# Patient Record
Sex: Female | Born: 1960
Health system: Southern US, Community
[De-identification: ages and names within clinical notes are randomized; demographics above are authoritative.]

## PROBLEM LIST (undated history)

## (undated) DIAGNOSIS — Z5189 Encounter for other specified aftercare: Secondary | ICD-10-CM

## (undated) DIAGNOSIS — T7840XA Allergy, unspecified, initial encounter: Secondary | ICD-10-CM

## (undated) DIAGNOSIS — E119 Type 2 diabetes mellitus without complications: Secondary | ICD-10-CM

## (undated) DIAGNOSIS — D649 Anemia, unspecified: Secondary | ICD-10-CM

## (undated) DIAGNOSIS — I341 Nonrheumatic mitral (valve) prolapse: Secondary | ICD-10-CM

## (undated) HISTORY — DX: Allergy, unspecified, initial encounter: T78.40XA

## (undated) HISTORY — DX: Type 2 diabetes mellitus without complications: E11.9

## (undated) HISTORY — DX: Anemia, unspecified: D64.9

## (undated) HISTORY — PX: APPENDECTOMY: SHX54

## (undated) HISTORY — DX: Encounter for other specified aftercare: Z51.89

## (undated) HISTORY — DX: Nonrheumatic mitral (valve) prolapse: I34.1

## (undated) HISTORY — PX: ROTATOR CUFF REPAIR: SHX139

---

## 1982-10-11 DIAGNOSIS — Z5189 Encounter for other specified aftercare: Secondary | ICD-10-CM

## 1982-10-11 HISTORY — DX: Encounter for other specified aftercare: Z51.89

## 1982-10-11 HISTORY — PX: TUBAL LIGATION: SHX77

## 2001-04-02 ENCOUNTER — Emergency Department (HOSPITAL_COMMUNITY): Admission: EM | Admit: 2001-04-02 | Discharge: 2001-04-02 | Payer: Self-pay | Admitting: Emergency Medicine

## 2001-04-02 ENCOUNTER — Encounter: Payer: Self-pay | Admitting: Emergency Medicine

## 2001-12-20 ENCOUNTER — Encounter: Admission: RE | Admit: 2001-12-20 | Discharge: 2001-12-20 | Payer: Self-pay | Admitting: Family Medicine

## 2002-02-09 ENCOUNTER — Encounter: Admission: RE | Admit: 2002-02-09 | Discharge: 2002-02-09 | Payer: Self-pay | Admitting: Family Medicine

## 2002-02-14 ENCOUNTER — Encounter: Payer: Self-pay | Admitting: Sports Medicine

## 2002-02-14 ENCOUNTER — Encounter: Admission: RE | Admit: 2002-02-14 | Discharge: 2002-02-14 | Payer: Self-pay | Admitting: Sports Medicine

## 2002-02-21 ENCOUNTER — Encounter: Admission: RE | Admit: 2002-02-21 | Discharge: 2002-02-21 | Payer: Self-pay | Admitting: Family Medicine

## 2002-05-10 ENCOUNTER — Encounter: Admission: RE | Admit: 2002-05-10 | Discharge: 2002-05-10 | Payer: Self-pay | Admitting: Family Medicine

## 2002-05-10 ENCOUNTER — Ambulatory Visit (HOSPITAL_COMMUNITY): Admission: RE | Admit: 2002-05-10 | Discharge: 2002-05-10 | Payer: Self-pay | Admitting: Family Medicine

## 2002-05-11 ENCOUNTER — Encounter: Admission: RE | Admit: 2002-05-11 | Discharge: 2002-05-11 | Payer: Self-pay | Admitting: Sports Medicine

## 2002-05-11 ENCOUNTER — Encounter: Payer: Self-pay | Admitting: Sports Medicine

## 2002-05-14 ENCOUNTER — Ambulatory Visit (HOSPITAL_COMMUNITY): Admission: RE | Admit: 2002-05-14 | Discharge: 2002-05-14 | Payer: Self-pay | Admitting: *Deleted

## 2002-05-25 ENCOUNTER — Encounter: Admission: RE | Admit: 2002-05-25 | Discharge: 2002-05-25 | Payer: Self-pay | Admitting: Family Medicine

## 2003-08-12 ENCOUNTER — Encounter (INDEPENDENT_AMBULATORY_CARE_PROVIDER_SITE_OTHER): Payer: Self-pay | Admitting: *Deleted

## 2003-08-12 LAB — CONVERTED CEMR LAB

## 2003-08-14 ENCOUNTER — Encounter: Admission: RE | Admit: 2003-08-14 | Discharge: 2003-08-14 | Payer: Self-pay | Admitting: Family Medicine

## 2003-09-25 ENCOUNTER — Encounter: Admission: RE | Admit: 2003-09-25 | Discharge: 2003-09-25 | Payer: Self-pay | Admitting: Sports Medicine

## 2004-07-01 ENCOUNTER — Ambulatory Visit: Payer: Self-pay | Admitting: Family Medicine

## 2004-07-01 ENCOUNTER — Encounter: Admission: RE | Admit: 2004-07-01 | Discharge: 2004-07-01 | Payer: Self-pay | Admitting: Sports Medicine

## 2004-07-03 ENCOUNTER — Ambulatory Visit: Payer: Self-pay | Admitting: Family Medicine

## 2006-12-08 DIAGNOSIS — R350 Frequency of micturition: Secondary | ICD-10-CM | POA: Insufficient documentation

## 2006-12-08 DIAGNOSIS — D649 Anemia, unspecified: Secondary | ICD-10-CM

## 2006-12-08 DIAGNOSIS — I341 Nonrheumatic mitral (valve) prolapse: Secondary | ICD-10-CM

## 2006-12-09 ENCOUNTER — Encounter (INDEPENDENT_AMBULATORY_CARE_PROVIDER_SITE_OTHER): Payer: Self-pay | Admitting: *Deleted

## 2007-03-14 ENCOUNTER — Other Ambulatory Visit: Admission: RE | Admit: 2007-03-14 | Discharge: 2007-03-14 | Payer: Self-pay | Admitting: Internal Medicine

## 2007-03-16 ENCOUNTER — Encounter: Admission: RE | Admit: 2007-03-16 | Discharge: 2007-03-16 | Payer: Self-pay | Admitting: Internal Medicine

## 2008-03-25 ENCOUNTER — Encounter: Admission: RE | Admit: 2008-03-25 | Discharge: 2008-03-25 | Payer: Self-pay | Admitting: Internal Medicine

## 2008-11-07 ENCOUNTER — Other Ambulatory Visit: Admission: RE | Admit: 2008-11-07 | Discharge: 2008-11-07 | Payer: Self-pay | Admitting: Internal Medicine

## 2008-11-18 ENCOUNTER — Encounter: Admission: RE | Admit: 2008-11-18 | Discharge: 2008-11-18 | Payer: Self-pay | Admitting: Internal Medicine

## 2009-03-13 ENCOUNTER — Emergency Department (HOSPITAL_COMMUNITY): Admission: EM | Admit: 2009-03-13 | Discharge: 2009-03-13 | Payer: Self-pay | Admitting: Emergency Medicine

## 2009-12-12 ENCOUNTER — Other Ambulatory Visit: Admission: RE | Admit: 2009-12-12 | Discharge: 2009-12-12 | Payer: Self-pay | Admitting: Internal Medicine

## 2010-07-10 ENCOUNTER — Emergency Department (HOSPITAL_COMMUNITY): Admission: EM | Admit: 2010-07-10 | Discharge: 2010-07-10 | Payer: Self-pay | Admitting: Emergency Medicine

## 2010-11-12 ENCOUNTER — Other Ambulatory Visit: Payer: Self-pay | Admitting: Physical Medicine and Rehabilitation

## 2010-11-12 DIAGNOSIS — R52 Pain, unspecified: Secondary | ICD-10-CM

## 2010-11-15 ENCOUNTER — Ambulatory Visit
Admission: RE | Admit: 2010-11-15 | Discharge: 2010-11-15 | Disposition: A | Discharge status: Home / Self Care | Payer: BC Managed Care – PPO | Source: Ambulatory Visit | Attending: Physical Medicine and Rehabilitation | Admitting: Physical Medicine and Rehabilitation

## 2010-11-15 DIAGNOSIS — R52 Pain, unspecified: Secondary | ICD-10-CM

## 2011-02-25 ENCOUNTER — Other Ambulatory Visit: Payer: Self-pay | Admitting: Internal Medicine

## 2011-02-25 DIAGNOSIS — Z1231 Encounter for screening mammogram for malignant neoplasm of breast: Secondary | ICD-10-CM

## 2011-02-26 ENCOUNTER — Ambulatory Visit
Admission: RE | Admit: 2011-02-26 | Discharge: 2011-02-26 | Disposition: A | Discharge status: Home / Self Care | Payer: BC Managed Care – PPO | Source: Ambulatory Visit | Attending: Internal Medicine | Admitting: Internal Medicine

## 2011-02-26 DIAGNOSIS — Z1231 Encounter for screening mammogram for malignant neoplasm of breast: Secondary | ICD-10-CM

## 2011-03-17 ENCOUNTER — Other Ambulatory Visit: Payer: Self-pay | Admitting: Obstetrics and Gynecology

## 2011-03-17 ENCOUNTER — Other Ambulatory Visit (HOSPITAL_COMMUNITY)
Admission: RE | Admit: 2011-03-17 | Discharge: 2011-03-17 | Disposition: A | Discharge status: Home / Self Care | Payer: BC Managed Care – PPO | Source: Ambulatory Visit | Attending: Obstetrics and Gynecology | Admitting: Obstetrics and Gynecology

## 2011-03-17 DIAGNOSIS — Z01419 Encounter for gynecological examination (general) (routine) without abnormal findings: Secondary | ICD-10-CM | POA: Insufficient documentation

## 2011-03-29 ENCOUNTER — Inpatient Hospital Stay (INDEPENDENT_AMBULATORY_CARE_PROVIDER_SITE_OTHER)
Admission: RE | Admit: 2011-03-29 | Discharge: 2011-03-29 | Disposition: A | Discharge status: Home / Self Care | Payer: BC Managed Care – PPO | Source: Ambulatory Visit | Attending: Family Medicine | Admitting: Family Medicine

## 2011-03-29 DIAGNOSIS — L02419 Cutaneous abscess of limb, unspecified: Secondary | ICD-10-CM

## 2011-07-12 ENCOUNTER — Other Ambulatory Visit: Payer: Self-pay | Admitting: Gastroenterology

## 2011-09-22 ENCOUNTER — Ambulatory Visit: Payer: BC Managed Care – PPO | Attending: Physical Medicine and Rehabilitation | Admitting: Physical Therapy

## 2011-09-22 DIAGNOSIS — M25519 Pain in unspecified shoulder: Secondary | ICD-10-CM | POA: Insufficient documentation

## 2011-09-22 DIAGNOSIS — M25539 Pain in unspecified wrist: Secondary | ICD-10-CM | POA: Insufficient documentation

## 2011-09-22 DIAGNOSIS — IMO0001 Reserved for inherently not codable concepts without codable children: Secondary | ICD-10-CM | POA: Insufficient documentation

## 2011-09-22 DIAGNOSIS — M25619 Stiffness of unspecified shoulder, not elsewhere classified: Secondary | ICD-10-CM | POA: Insufficient documentation

## 2011-10-01 ENCOUNTER — Ambulatory Visit: Payer: BC Managed Care – PPO

## 2011-10-06 ENCOUNTER — Ambulatory Visit: Payer: BC Managed Care – PPO | Admitting: Physical Therapy

## 2011-10-13 ENCOUNTER — Encounter: Payer: BC Managed Care – PPO | Admitting: Physical Therapy

## 2011-10-19 ENCOUNTER — Ambulatory Visit: Payer: BC Managed Care – PPO | Attending: Physical Medicine and Rehabilitation | Admitting: Physical Therapy

## 2011-10-19 DIAGNOSIS — M25619 Stiffness of unspecified shoulder, not elsewhere classified: Secondary | ICD-10-CM | POA: Insufficient documentation

## 2011-10-19 DIAGNOSIS — M25539 Pain in unspecified wrist: Secondary | ICD-10-CM | POA: Insufficient documentation

## 2011-10-19 DIAGNOSIS — IMO0001 Reserved for inherently not codable concepts without codable children: Secondary | ICD-10-CM | POA: Insufficient documentation

## 2011-10-19 DIAGNOSIS — M25519 Pain in unspecified shoulder: Secondary | ICD-10-CM | POA: Insufficient documentation

## 2011-10-26 ENCOUNTER — Ambulatory Visit: Payer: BC Managed Care – PPO | Admitting: Physical Therapy

## 2011-10-27 ENCOUNTER — Encounter: Payer: BC Managed Care – PPO | Admitting: Physical Therapy

## 2011-11-02 ENCOUNTER — Encounter: Payer: BC Managed Care – PPO | Admitting: Rehabilitative and Restorative Service Providers"

## 2011-11-05 ENCOUNTER — Encounter: Payer: BC Managed Care – PPO | Admitting: Physical Therapy

## 2011-11-10 ENCOUNTER — Other Ambulatory Visit: Payer: Self-pay | Admitting: Orthopedic Surgery

## 2011-11-10 DIAGNOSIS — M25512 Pain in left shoulder: Secondary | ICD-10-CM

## 2011-11-17 ENCOUNTER — Ambulatory Visit
Admission: RE | Admit: 2011-11-17 | Discharge: 2011-11-17 | Disposition: A | Discharge status: Home / Self Care | Payer: BC Managed Care – PPO | Source: Ambulatory Visit | Attending: Orthopedic Surgery | Admitting: Orthopedic Surgery

## 2011-11-17 DIAGNOSIS — M25512 Pain in left shoulder: Secondary | ICD-10-CM

## 2012-07-12 ENCOUNTER — Other Ambulatory Visit: Payer: Self-pay | Admitting: Internal Medicine

## 2012-07-12 DIAGNOSIS — Z1231 Encounter for screening mammogram for malignant neoplasm of breast: Secondary | ICD-10-CM

## 2012-08-29 ENCOUNTER — Other Ambulatory Visit (HOSPITAL_COMMUNITY)
Admission: RE | Admit: 2012-08-29 | Discharge: 2012-08-29 | Disposition: A | Discharge status: Home / Self Care | Payer: BC Managed Care – PPO | Source: Ambulatory Visit | Attending: Obstetrics and Gynecology | Admitting: Obstetrics and Gynecology

## 2012-08-29 ENCOUNTER — Ambulatory Visit
Admission: RE | Admit: 2012-08-29 | Discharge: 2012-08-29 | Disposition: A | Discharge status: Home / Self Care | Payer: BC Managed Care – PPO | Source: Ambulatory Visit | Attending: Internal Medicine | Admitting: Internal Medicine

## 2012-08-29 ENCOUNTER — Other Ambulatory Visit: Payer: Self-pay | Admitting: Obstetrics and Gynecology

## 2012-08-29 DIAGNOSIS — Z01419 Encounter for gynecological examination (general) (routine) without abnormal findings: Secondary | ICD-10-CM | POA: Insufficient documentation

## 2012-08-29 DIAGNOSIS — Z1231 Encounter for screening mammogram for malignant neoplasm of breast: Secondary | ICD-10-CM

## 2013-06-18 ENCOUNTER — Emergency Department (HOSPITAL_COMMUNITY)
Admission: EM | Admit: 2013-06-18 | Discharge: 2013-06-18 | Disposition: A | Discharge status: Home / Self Care | Payer: BC Managed Care – PPO | Attending: Emergency Medicine | Admitting: Emergency Medicine

## 2013-06-18 ENCOUNTER — Encounter (HOSPITAL_COMMUNITY): Payer: Self-pay | Admitting: Emergency Medicine

## 2013-06-18 ENCOUNTER — Emergency Department (HOSPITAL_COMMUNITY): Payer: BC Managed Care – PPO

## 2013-06-18 DIAGNOSIS — R404 Transient alteration of awareness: Secondary | ICD-10-CM

## 2013-06-18 DIAGNOSIS — R4189 Other symptoms and signs involving cognitive functions and awareness: Secondary | ICD-10-CM

## 2013-06-18 DIAGNOSIS — R4701 Aphasia: Secondary | ICD-10-CM | POA: Insufficient documentation

## 2013-06-18 DIAGNOSIS — R51 Headache: Secondary | ICD-10-CM | POA: Insufficient documentation

## 2013-06-18 DIAGNOSIS — Z3202 Encounter for pregnancy test, result negative: Secondary | ICD-10-CM | POA: Insufficient documentation

## 2013-06-18 DIAGNOSIS — R55 Syncope and collapse: Secondary | ICD-10-CM | POA: Insufficient documentation

## 2013-06-18 DIAGNOSIS — R5381 Other malaise: Secondary | ICD-10-CM | POA: Insufficient documentation

## 2013-06-18 LAB — PREGNANCY, URINE: Preg Test, Ur: NEGATIVE

## 2013-06-18 LAB — COMPREHENSIVE METABOLIC PANEL
ALT: 20 U/L (ref 0–35)
AST: 19 U/L (ref 0–37)
Albumin: 3.8 g/dL (ref 3.5–5.2)
CO2: 26 mEq/L (ref 19–32)
Chloride: 100 mEq/L (ref 96–112)
Creatinine, Ser: 0.65 mg/dL (ref 0.50–1.10)
GFR calc non Af Amer: >90 mL/min (ref >90)
Potassium: 3.7 mEq/L (ref 3.5–5.1)
Sodium: 135 mEq/L (ref 135–145)
Total Bilirubin: 0.4 mg/dL (ref 0.3–1.2)

## 2013-06-18 LAB — URINALYSIS, ROUTINE W REFLEX MICROSCOPIC
Glucose, UA: NEGATIVE mg/dL
Ketones, ur: NEGATIVE mg/dL
Leukocytes, UA: NEGATIVE
pH: 6 (ref 5.0–8.0)

## 2013-06-18 LAB — URINE MICROSCOPIC-ADD ON

## 2013-06-18 LAB — CBC
MCV: 84.5 fL (ref 78.0–100.0)
Platelets: 229 10*3/uL (ref 150–400)
RBC: 4.26 MIL/uL (ref 3.87–5.11)
RDW: 12.5 % (ref 11.5–15.5)
WBC: 4.6 10*3/uL (ref 4.0–10.5)

## 2013-06-18 MED ORDER — ACETAMINOPHEN 325 MG PO TABS
650.0000 mg | ORAL_TABLET | Freq: Once | ORAL | Status: AC
  Administered 2013-06-18: 650 mg via ORAL
  Filled 2013-06-18: qty 2

## 2013-06-18 NOTE — Consult Note (Signed)
Neurology Consultation Reason for Consult: unresponsiveness Referring Physician: Jarome Matin,   CC: Unresponsiveness  History is obtained from:Patient  HPI: Teresa West is a 52 y.o. female who was at work, feeling "bad" all morning and then began having chest pain as well as lightheadedness. She was helped to the floor where she became unresopnsive for approximately one minute. EMS was called. On arrival, she would not speak and seemed to have bilateral weakness. She was brought to the ER>   LKW: 10:15 am  tpa given: no, symptoms resolved, NIHSS 0    ROS: A 14 point ROS was performed and is negative except as noted in the HPI.  PMH: None  Family History: DM, HTN  Social History: Tob: denies  Exam: Current vital signs: There were no vitals taken for this visit. Vital signs in last 24 hours:    General: in bed, NAD CV: rrr The patient had multiple inconsistencies in her exam, but with repeated testing was seen to be without deficit.   Mental Status: Patient is awake, alert, oriented to person, place, month, year, and situation. Immediate and remote memory are intact. Patient is able to give a clear and coherent history. No signs of aphasia or neglect Cranial Nerves: II: Visual Fields are full. Pupils are equal, round, and reactive to light.  Discs are without papilledema. III,IV, VI: EOMI without ptosis or diploplia.  V: Facial sensation is symmetric to temperature VII: Facial movement is symmetric.  VIII: hearing is intact to voice X: Uvula elevates symmetrically XI: Shoulder shrug is symmetric. XII: tongue is midline without atrophy or fasciculations.  Motor: Tone is normal. Bulk is normal. 5/5 strength was present in all four extremities, though inconsistent exam.  Sensory: Sensation is symmetric to light touch and temperature in the arms and legs. Deep Tendon Reflexes: 2+ and symmetric in the biceps and patellae.  Plantars: Toes are downgoing bilaterally.   Cerebellar: FNF are intact bilaterally Gait: Not assessed due to acute nature of evaluation and multiple medical monitors in ED setting as well as chest pain.    I have reviewed labs in epic and the results pertinent to this consultation are: pending  I have reviewed the images obtained:CT head - negatvie  Impression: 52 yo F with sncope in the setting of lightheadness and chest pain. Her exam(including distractible tremor and inconsistencies) is suggestive of a psychogenic etiology to her neurological symptoms. I have a very low suspicion for seizure and would not initiate workup or treatment unless she had other events in the future concerning for seizure.   Recommendations: 1) Defer further workup for chest pain/syncope to ED physicians. 2) Neurology will sign off, please call if there are any further questions.    Ritta Slot, MD [facility] [phone]  If 7pm- 7am, please page neurology on call at [phone].

## 2013-06-18 NOTE — ED Notes (Signed)
Code stroke cancelled per Dr. Kirkpatrick °

## 2013-06-18 NOTE — ED Notes (Signed)
C/o h/a

## 2013-06-18 NOTE — ED Notes (Signed)
EDP exam/cleared for CT.  

## 2013-06-18 NOTE — Code Documentation (Signed)
52 year old female presented to [facility] via [facility] as code stroke. Stroke was called in the field at 1042.  Patient arrived to ED at 1048.  LSW 1015.  EDP exam at 1049 - cleared for CT scan.  Per report patient was at work this AM and c/o not feeling well all morning - had sudden episode of feeling light headed and stated she was going to pass out.  Co-workers lowered her to the ground.  She had approximate one minute of blank stare without abnormal movement no incontinence - then would not talk.  On exam she exhibited some inconsistencies without clear focal signs - NIHSS 0. Dr. Amada Jupiter at bedside.  Sx resolved - clock reset at 1108 - handoff to [employer] -  Call as needed.

## 2013-06-18 NOTE — ED Notes (Signed)
Pt in room with husband-- eating [place] sandwich-- ambulated to bathroom without difficulty

## 2013-06-18 NOTE — ED Provider Notes (Signed)
CSN: [id number]     Arrival date & time 06/18/13  1048 History   First MD Initiated Contact with Patient 06/18/13 1122     Chief Complaint  Patient presents with  . Code Stroke   (Consider location/radiation/quality/duration/timing/severity/associated sxs/prior Treatment) HPI Comments: 52 yo AA female presents via EMS for concern for syncope/stroke.  Pt states she hasn't felt well this AM.  She went to work and became lightheaded and "blacked out".  She was helped to the floor by colleagues. Upon arousal she was weak and aphasic.  EMS was called.   Pt denies medications.  No recent infections.  Pt is very involved in planning her daughter's wedding and she is somewhat "stressed" b/c of this.  No f/c, n/v/d, abd pain, vaginal symptoms, GU symptoms.  No h/o seizure or neurologic d/o.    "Code stroke" initiated and pt went directly to CT upon arrival where she was met by myself and Dr. Luan Pulling (neuro).  CT was neg and pt's exam was inconsistent with stroke.  Pt  Patient is a 52 y.o. female presenting with syncope. The history is provided by the patient.  Loss of Consciousness Episode history:  Single Duration:  10 seconds Timing:  Constant Chronicity:  New Context: normal activity   Context: not blood draw, not bowel movement, not dehydration, not exertion, not inactivity, not medication change, not sight of blood, not sitting down, not standing up and not urination   Witnessed: yes   Relieved by:  Nothing Worsened by:  Nothing tried Associated symptoms: headaches and weakness   Associated symptoms: no dizziness and no seizures     No past medical history on file. No past surgical history on file. No family history on file. History  Substance Use Topics  . Smoking status: Not on file  . Smokeless tobacco: Not on file  . Alcohol Use: Not on file   OB History   No data available     Review of Systems  Eyes: Negative.   Respiratory: Negative.   Cardiovascular: Positive for  syncope.  Gastrointestinal: Negative.   Musculoskeletal: Negative.   Neurological: Positive for speech difficulty, weakness and headaches. Negative for dizziness, tremors, seizures, syncope, facial asymmetry, light-headedness and numbness.       EMS reported aphasia and global weakness, but these symptoms stopped once she arrived to ER.    Psychiatric/Behavioral: Negative.     Allergies  Review of patient's allergies indicates not on file.  Home Medications  No current outpatient prescriptions on file. BP 121/64  Pulse 62  Temp(Src) 98 F (36.7 C) (Oral)  Resp 14  SpO2 99%  LMP 01/05/2011 Physical Exam  Constitutional: She is oriented to person, place, and time. She appears well-developed and well-nourished.  HENT:  Head: Normocephalic and atraumatic.  Eyes: Conjunctivae and EOM are normal. Pupils are equal, round, and reactive to light.  Neck: Normal range of motion. Neck supple.  Cardiovascular: Normal rate and regular rhythm.   Pulmonary/Chest: Effort normal and breath sounds normal.  Abdominal: Soft. Bowel sounds are normal.  Musculoskeletal: Normal range of motion.  Neurological: She is alert and oriented to person, place, and time. She has normal reflexes. No cranial nerve deficit. Coordination normal.  Full neuro exam performed by neurology.      ED Course  Procedures (including critical care time) Labs Review Results for orders placed during the hospital encounter of 06/18/13  CBC      Result Value Range   WBC 4.6  4.0 - 10.5  K/uL   RBC 4.26  3.87 - 5.11 MIL/uL   Hemoglobin 12.2  12.0 - 15.0 g/dL   HCT 57.8  46.9 - 62.9 %   MCV 84.5  78.0 - 100.0 fL   MCH 28.6  26.0 - 34.0 pg   MCHC 33.9  30.0 - 36.0 g/dL   RDW 52.8  41.3 - 24.4 %   Platelets 229  150 - 400 K/uL  COMPREHENSIVE METABOLIC PANEL      Result Value Range   Sodium 135  135 - 145 mEq/L   Potassium 3.7  3.5 - 5.1 mEq/L   Chloride 100  96 - 112 mEq/L   CO2 26  19 - 32 mEq/L   Glucose, Bld 111 (*)  70 - 99 mg/dL   BUN 10  6 - 23 mg/dL   Creatinine, Ser 0.10  0.50 - 1.10 mg/dL   Calcium 9.3  8.4 - 27.2 mg/dL   Total Protein 7.6  6.0 - 8.3 g/dL   Albumin 3.8  3.5 - 5.2 g/dL   AST 19  0 - 37 U/L   ALT 20  0 - 35 U/L   Alkaline Phosphatase 99  39 - 117 U/L   Total Bilirubin 0.4  0.3 - 1.2 mg/dL   GFR calc non Af Amer >90  >90 mL/min   GFR calc Af Amer >90  >90 mL/min  TROPONIN I      Result Value Range   Troponin I <0.30  <0.30 ng/mL  URINALYSIS, ROUTINE W REFLEX MICROSCOPIC      Result Value Range   Color, Urine YELLOW  YELLOW   APPearance CLEAR  CLEAR   Specific Gravity, Urine 1.010  1.005 - 1.030   pH 6.0  5.0 - 8.0   Glucose, UA NEGATIVE  NEGATIVE mg/dL   Hgb urine dipstick TRACE (*) NEGATIVE   Bilirubin Urine NEGATIVE  NEGATIVE   Ketones, ur NEGATIVE  NEGATIVE mg/dL   Protein, ur NEGATIVE  NEGATIVE mg/dL   Urobilinogen, UA 0.2  0.0 - 1.0 mg/dL   Nitrite NEGATIVE  NEGATIVE   Leukocytes, UA NEGATIVE  NEGATIVE  PREGNANCY, URINE      Result Value Range   Preg Test, Ur NEGATIVE  NEGATIVE  URINE MICROSCOPIC-ADD ON      Result Value Range   Squamous Epithelial / LPF FEW (*) RARE   WBC, UA 0-2  <3 WBC/hpf   RBC / HPF 0-2  <3 RBC/hpf   Bacteria, UA FEW (*) RARE    Date: 06/18/2013  Rate: 64  Rhythm: normal sinus rhythm  QRS Axis: normal  Intervals: normal  ST/T Wave abnormalities: nonspecific T wave changes  Conduction Disutrbances:none  Narrative Interpretation: Negative for Brugada, prolonged QTC, WPW, HOCM  Old EKG Reviewed: none available and changes noted  Imaging Review Ct Head Wo Contrast  06/18/2013   *RADIOLOGY REPORT*  Clinical Data:  Syncopal episode, aphasia  CT HEAD WITHOUT CONTRAST  Technique:  Contiguous axial images were obtained from the base of the skull through the vertex without contrast  Comparison:  None.  Findings:  The brain has a normal appearance without evidence for hemorrhage, acute infarction, hydrocephalus, or mass lesion.  There is no  extra axial fluid collection.  The skull and paranasal sinuses are normal.  IMPRESSION: Normal CT of the head without contrast.    Findings called to Dr. Amada Jupiter at the 11:20 a.m.   Original Report Authenticated By: Judie Petit. Miles Costain, M.D.    MDM   1. Unresponsiveness  52 yo AA female presents to ER with AMS.  There was concern for stroke on arrival.  CT was negative and neurology evaluation was negative.  In ER pt had normal neurologic exam.  A syncope workup was performed which was negative.  Labwork, EKG, and PE were benign.  I do not suspect Stroke, TIA, ACS, cardiac syncope, SBI, or emergent etiology at this point.  The presenting symptom complex may be multifactorial and stress may be a contributing factor.  I had a lengthy discussion with pt and her husband regarding her symptoms and her ER workup.  They agree with the plan.  Of note the pt had no symptoms upon returning from the CT scanner.  She will f/u with her PCM for an outpatient evaluation.  ER precautions were given.    The patient appears reasonably screened and/or stabilized for discharge and I doubt any other medical condition or other [facility] requiring further screening, evaluation, or treatment in the ED at this time prior to discharge.     Darlys Gales, MD 06/19/13 [phone]

## 2013-06-18 NOTE — ED Notes (Signed)
Code Stroke Called

## 2013-06-18 NOTE — ED Notes (Signed)
Pt arrived in CT 

## 2013-06-18 NOTE — ED Notes (Signed)
To Ed via [facility] from [employer] with pt with c/o stroke like symptoms last seen normal at 1015-- pt c/o not feeling well this am, then stated "I am going to pass out" was lowered to the floor by coworkers, had blank stare for approx 1 minute-- NO incontinence, No seizure type activity witnessed.

## 2013-06-18 NOTE — ED Notes (Signed)
Stroke team arrived

## 2013-06-18 NOTE — ED Notes (Signed)
Pt arrived to ED

## 2013-10-15 ENCOUNTER — Other Ambulatory Visit: Payer: Self-pay | Admitting: Internal Medicine

## 2013-10-15 DIAGNOSIS — R599 Enlarged lymph nodes, unspecified: Secondary | ICD-10-CM

## 2013-10-16 ENCOUNTER — Other Ambulatory Visit: Payer: Self-pay | Admitting: Internal Medicine

## 2013-10-16 DIAGNOSIS — N644 Mastodynia: Secondary | ICD-10-CM

## 2013-10-16 DIAGNOSIS — R599 Enlarged lymph nodes, unspecified: Secondary | ICD-10-CM

## 2013-10-17 ENCOUNTER — Ambulatory Visit
Admission: RE | Admit: 2013-10-17 | Discharge: 2013-10-17 | Disposition: A | Discharge status: Home / Self Care | Payer: No Typology Code available for payment source | Source: Ambulatory Visit | Attending: Internal Medicine | Admitting: Internal Medicine

## 2013-10-17 DIAGNOSIS — R599 Enlarged lymph nodes, unspecified: Secondary | ICD-10-CM

## 2013-10-17 DIAGNOSIS — N644 Mastodynia: Secondary | ICD-10-CM

## 2013-11-17 ENCOUNTER — Ambulatory Visit (INDEPENDENT_AMBULATORY_CARE_PROVIDER_SITE_OTHER): Payer: No Typology Code available for payment source | Admitting: Family Medicine

## 2013-11-17 VITALS — BP 118/72 | HR 86 | Temp 99.5°F | Resp 16 | Ht 66.0 in | Wt 216.2 lb

## 2013-11-17 DIAGNOSIS — J09X2 Influenza due to identified novel influenza A virus with other respiratory manifestations: Secondary | ICD-10-CM

## 2013-11-17 DIAGNOSIS — R05 Cough: Secondary | ICD-10-CM

## 2013-11-17 DIAGNOSIS — R059 Cough, unspecified: Secondary | ICD-10-CM

## 2013-11-17 DIAGNOSIS — R52 Pain, unspecified: Secondary | ICD-10-CM

## 2013-11-17 MED ORDER — IPRATROPIUM BROMIDE 0.03 % NA SOLN: 2.0000 | Freq: Four times a day (QID) | NASAL | Status: DC

## 2013-11-17 MED ORDER — BENZONATATE 100 MG PO CAPS: 100.0000 mg | ORAL_CAPSULE | Freq: Three times a day (TID) | ORAL | Status: DC | PRN

## 2013-11-17 MED ORDER — HYDROCODONE-HOMATROPINE 5-1.5 MG/5ML PO SYRP: 5.0000 mL | ORAL_SOLUTION | ORAL | Status: DC | PRN

## 2013-11-17 NOTE — Progress Notes (Signed)
Subjective: 53 year old lady who has had about a 4 day history of flulike symptoms. She has had cough, congestion, sneezing, body aching, eyes hurt to touch, etc. She thinks she had a fever but she never really took her temperature. She works at hospice. She went home early on Thursday and stayed off Friday. The symptoms it started Wednesday. She did get a flu shot this year.  Objective: Pleasant lady does not feel well. Her TMs are normal. Nose congested. Throat clear. Neck supple without significant nodes. Chest is clear to auscultation. Heart regular without murmurs. And soft without masses or tenderness.  Assessment: Influenza/flu like symptoms Body aches Cough  Plan: Treat symptomatically. She is past the point of value of taking the Tamiflu.

## 2013-11-17 NOTE — Patient Instructions (Signed)
Drink plenty of fluids and get a lot of rest  Take the cough syrup 1 teaspoon every 4-6 hours as needed for cough  Use the Atrovent nasal nose spray 2 sprays in each nostril every 6 or 8 hours as needed for congestion  If getting worse at anytime, especially more difficulty breathing, return for recheck  Return as needed  Influenza, Adult Influenza ("the flu") is a viral infection of the respiratory tract. It occurs more often in winter months because people spend more time in close contact with one another. Influenza can make you feel very sick. Influenza easily spreads from person to person (contagious). CAUSES  Influenza is caused by a virus that infects the respiratory tract. You can catch the virus by breathing in droplets from an infected person's cough or sneeze. You can also catch the virus by touching something that was recently contaminated with the virus and then touching your mouth, nose, or eyes. SYMPTOMS  Symptoms typically last 4 to 10 days and may include:  Fever.  Chills.  Headache, body aches, and muscle aches.  Sore throat.  Chest discomfort and cough.  Poor appetite.  Weakness or feeling tired.  Dizziness.  Nausea or vomiting. DIAGNOSIS  Diagnosis of influenza is often made based on your history and a physical exam. A nose or throat swab test can be done to confirm the diagnosis. RISKS AND COMPLICATIONS You may be at risk for a more severe case of influenza if you smoke cigarettes, have diabetes, have chronic heart disease (such as heart failure) or lung disease (such as asthma), or if you have a weakened immune system. Elderly people and pregnant women are also at risk for more serious infections. The most common complication of influenza is a lung infection (pneumonia). Sometimes, this complication can require emergency medical care and may be life-threatening. PREVENTION  An annual influenza vaccination (flu shot) is the best way to avoid getting  influenza. An annual flu shot is now routinely recommended for all adults in the [place] TREATMENT  In mild cases, influenza goes away on its own. Treatment is directed at relieving symptoms. For more severe cases, your caregiver may prescribe antiviral medicines to shorten the sickness. Antibiotic medicines are not effective, because the infection is caused by a virus, not by bacteria. HOME CARE INSTRUCTIONS  Only take over-the-counter or prescription medicines for pain, discomfort, or fever as directed by your caregiver.  Use a cool mist humidifier to make breathing easier.  Get plenty of rest until your temperature returns to normal. This usually takes 3 to 4 days.  Drink enough fluids to keep your urine clear or pale yellow.  Cover your mouth and nose when coughing or sneezing, and wash your hands well to avoid spreading the virus.  Stay home from work or school until your fever has been gone for at least 1 full day. SEEK MEDICAL CARE IF:   You have chest pain or a deep cough that worsens or produces more mucus.  You have nausea, vomiting, or diarrhea. SEEK IMMEDIATE MEDICAL CARE IF:   You have difficulty breathing, shortness of breath, or your skin or nails turn bluish.  You have severe neck pain or stiffness.  You have a severe headache, facial pain, or earache.  You have a worsening or recurring fever.  You have nausea or vomiting that cannot be controlled. MAKE SURE YOU:  Understand these instructions.  Will watch your condition.  Will get help right away if you are not doing well  or get worse. Document Released: 09/24/2000 Document Revised: 03/28/2012 Document Reviewed: 12/27/2011 [facility] Patient Information 2014 [place], [place].

## 2014-04-11 ENCOUNTER — Encounter (HOSPITAL_COMMUNITY): Payer: Self-pay | Admitting: Emergency Medicine

## 2014-04-11 ENCOUNTER — Emergency Department (INDEPENDENT_AMBULATORY_CARE_PROVIDER_SITE_OTHER)
Admission: EM | Admit: 2014-04-11 | Discharge: 2014-04-11 | Disposition: A | Discharge status: Home / Self Care | Payer: No Typology Code available for payment source | Source: Home / Self Care | Attending: Family Medicine | Admitting: Family Medicine

## 2014-04-11 DIAGNOSIS — B029 Zoster without complications: Secondary | ICD-10-CM

## 2014-04-11 MED ORDER — ACYCLOVIR 800 MG PO TABS: ORAL_TABLET | ORAL | Status: DC

## 2014-04-11 MED ORDER — HYDROCODONE-ACETAMINOPHEN 5-325 MG PO TABS: 2.0000 | ORAL_TABLET | ORAL | Status: DC | PRN

## 2014-04-11 NOTE — Discharge Instructions (Signed)
Shingles °Shingles (herpes zoster) is an infection that is caused by the same virus that causes chickenpox (varicella). The infection causes a painful skin rash and fluid-filled blisters, which eventually break open, crust over, and heal. It may occur in any area of the body, but it usually affects only one side of the body or face. The pain of shingles usually lasts about 1 month. However, some people with shingles may develop long-term (chronic) pain in the affected area of the body. °Shingles often occurs many years after the person had chickenpox. It is more common: °· In people older than 50 years. °· In people with weakened immune systems, such as those with HIV, AIDS, or cancer. °· In people taking medicines that weaken the immune system, such as transplant medicines. °· In people under great stress. °CAUSES  °Shingles is caused by the varicella zoster virus (VZV), which also causes chickenpox. After a person is infected with the virus, it can remain in the person's body for years in an inactive state (dormant). To cause shingles, the virus reactivates and breaks out as an infection in a nerve root. °The virus can be spread from person to person (contagious) through contact with open blisters of the shingles rash. It will only spread to people who have not had chickenpox. When these people are exposed to the virus, they may develop chickenpox. They will not develop shingles. Once the blisters scab over, the person is no longer contagious and cannot spread the virus to others. °SYMPTOMS  °Shingles shows up in stages. The initial symptoms may be pain, itching, and tingling in an area of the skin. This pain is usually described as burning, stabbing, or throbbing. In a few days or weeks, a painful red rash will appear in the area where the pain, itching, and tingling were felt. The rash is usually on one side of the body in a band or belt-like pattern. Then, the rash usually turns into fluid-filled blisters. They  will scab over and dry up in approximately 2-3 weeks. °Flu-like symptoms may also occur with the initial symptoms, the rash, or the blisters. These may include: °· Fever. °· Chills. °· Headache. °· Upset stomach. °DIAGNOSIS  °Your caregiver will perform a skin exam to diagnose shingles. Skin scrapings or fluid samples may also be taken from the blisters. This sample will be examined under a microscope or sent to a lab for further testing. °TREATMENT  °There is no specific cure for shingles. Your caregiver will likely prescribe medicines to help you manage the pain, recover faster, and avoid long-term problems. This may include antiviral drugs, anti-inflammatory drugs, and pain medicines. °HOME CARE INSTRUCTIONS  °· Take a cool bath or apply cool compresses to the area of the rash or blisters as directed. This may help with the pain and itching.   °· Only take over-the-counter or prescription medicines as directed by your caregiver.   °· Rest as directed by your caregiver. °· Keep your rash and blisters clean with mild soap and cool water or as directed by your caregiver.  °· Do not pick your blisters or scratch your rash. Apply an anti-itch cream or numbing creams to the affected area as directed by your caregiver. °· Keep your shingles rash covered with a loose bandage (dressing). °· Avoid skin contact with: °¨ Babies.   °¨ Pregnant women.   °¨ Children with eczema.   °¨ Elderly people with transplants.   °¨ People with chronic illnesses, such as leukemia or AIDS.   °· Wear loose-fitting clothing to help ease the   pain of material rubbing against the rash. °· Keep all follow-up appointments with your caregiver. If the area involved is on your face, you may receive a referral for follow-up to a specialist, such as an eye doctor (ophthalmologist) or an ear, nose, and throat (ENT) doctor. Keeping all follow-up appointments will help you avoid eye complications, chronic pain, or disability.   °SEEK IMMEDIATE MEDICAL  CARE IF:  °· You have facial pain, pain around the eye area, or loss of feeling on one side of your face. °· You have ear pain or ringing in your ear. °· You have loss of taste. °· Your pain is not relieved with prescribed medicines.   °· Your redness or swelling spreads.   °· You have more pain and swelling.  °· Your condition is worsening or has changed.   °· You have a fever or persistent symptoms for more than 2-3 days. °· You have a fever and your symptoms suddenly get worse. °MAKE SURE YOU: °· Understand these instructions. °· Will watch your condition. °· Will get help right away if you are not doing well or get worse. °Document Released: 09/27/2005 Document Revised: 06/21/2012 Document Reviewed: 05/11/2012 °[employer]® Patient Information ©2015 [employer]. This information is not intended to replace advice given to you by your health care provider. Make sure you discuss any questions you have with your health care provider. ° °

## 2014-04-11 NOTE — ED Provider Notes (Signed)
CSN: [id number]     Arrival date & time 04/11/14  1146 History   None    Chief Complaint  Patient presents with  . Tingling   (Consider location/radiation/quality/duration/timing/severity/associated sxs/prior Treatment) Patient is a 53 y.o. female presenting with rash. The history is provided by the patient. No language interpreter was used.  Rash Location:  Torso Torso rash location:  L chest Quality: itchiness and redness   Quality: not blistering   Severity:  Moderate Onset quality:  Gradual Timing:  Constant Progression:  Worsening Chronicity:  New Relieved by:  Nothing Worsened by:  Nothing tried Ineffective treatments:  None tried Associated symptoms: no fever     Past Medical History  Diagnosis Date  . Allergy   . Anemia   . Blood transfusion without reported diagnosis   . Mitral valve prolapse    Past Surgical History  Procedure Laterality Date  . Tubal ligation  1984  . Rotator cuff repair    . Appendectomy     Family History  Problem Relation Age of Onset  . Diabetes Mother   . Hypertension Mother    History  Substance Use Topics  . Smoking status: Never Smoker   . Smokeless tobacco: Not on file  . Alcohol Use: No   OB History   Grav Para Term Preterm Abortions TAB SAB Ect Mult Living                 Review of Systems  Constitutional: Negative for fever.  Skin: Positive for rash.  All other systems reviewed and are negative.   Allergies  Naproxen  Home Medications   Prior to Admission medications   Medication Sig Start Date End Date Taking? Authorizing Provider  benzonatate (TESSALON) 100 MG capsule Take 1-2 capsules (100-200 mg total) by mouth 3 (three) times daily as needed for cough. 11/17/13   Peyton Najjaravid H Hopper, MD  HYDROcodone-homatropine [facility](HYCODAN) 5-1.5 MG/5ML syrup Take 5 mLs by mouth every 4 (four) hours as needed for cough. 11/17/13   Peyton Najjaravid H Hopper, MD  ipratropium (ATROVENT) 0.03 % nasal spray Place 2 sprays into the nose 4 (four)  times daily. 11/17/13   Peyton Najjaravid H Hopper, MD   BP 128/63  Pulse 87  Temp(Src) 98.1 F (36.7 C) (Oral)  Resp 18  SpO2 100%  LMP 01/05/2011 Physical Exam  Constitutional: She appears well-developed and well-nourished.  Cardiovascular: Normal rate.   Pulmonary/Chest: She is in respiratory distress.  Abdominal: Soft.  Musculoskeletal: Normal range of motion.  Neurological: She is alert.  Skin: There is erythema.  Psychiatric: She has a normal mood and affect.    ED Course  Procedures (including critical care time) Labs Review Labs Reviewed - No data to display  Imaging Review No results found.   MDM   1. Shingles    Pt thinks she has shingles,  No rash noted however history is consistent with shingles rx for acyclovir rx for hydrocodone See Dr. Nehemiah SettlePolite for recheck next week   Elson AreasLeslie K Sofia, PA-C 04/11/14 1349

## 2014-04-11 NOTE — ED Notes (Signed)
Pt  Re[prts  Tingling  Burning  Itching  Area  Around  l  Flank   That  She  Noticed  About  6  Days  Ago      Reports  Some  Pain as  Well

## 2014-04-12 NOTE — ED Provider Notes (Signed)
Medical screening examination/treatment/procedure(s) were performed by a resident physician or non-physician practitioner and as the supervising physician I was immediately available for consultation/collaboration.  Evan Corey, MD    Evan S Corey, MD 04/12/14 1716 

## 2014-08-21 ENCOUNTER — Other Ambulatory Visit (INDEPENDENT_AMBULATORY_CARE_PROVIDER_SITE_OTHER): Payer: No Typology Code available for payment source

## 2014-08-21 ENCOUNTER — Ambulatory Visit (INDEPENDENT_AMBULATORY_CARE_PROVIDER_SITE_OTHER): Payer: No Typology Code available for payment source | Admitting: Internal Medicine

## 2014-08-21 ENCOUNTER — Encounter: Payer: Self-pay | Admitting: Internal Medicine

## 2014-08-21 VITALS — BP 120/78 | HR 96 | Temp 98.3°F | Resp 14 | Ht 66.0 in | Wt 223.4 lb

## 2014-08-21 DIAGNOSIS — E669 Obesity, unspecified: Secondary | ICD-10-CM

## 2014-08-21 DIAGNOSIS — D649 Anemia, unspecified: Secondary | ICD-10-CM | POA: Diagnosis not present

## 2014-08-21 DIAGNOSIS — I341 Nonrheumatic mitral (valve) prolapse: Secondary | ICD-10-CM | POA: Diagnosis not present

## 2014-08-21 DIAGNOSIS — Z0000 Encounter for general adult medical examination without abnormal findings: Secondary | ICD-10-CM | POA: Diagnosis not present

## 2014-08-21 DIAGNOSIS — L989 Disorder of the skin and subcutaneous tissue, unspecified: Secondary | ICD-10-CM | POA: Diagnosis not present

## 2014-08-21 DIAGNOSIS — R238 Other skin changes: Secondary | ICD-10-CM

## 2014-08-21 LAB — BASIC METABOLIC PANEL
BUN: 8 mg/dL (ref 6–23)
CO2: 22 mEq/L (ref 19–32)
CREATININE: 0.7 mg/dL (ref 0.4–1.2)
Calcium: 9.4 mg/dL (ref 8.4–10.5)
Chloride: 103 mEq/L (ref 96–112)
GFR: 112.2 mL/min (ref >60.00)
Glucose, Bld: 112 mg/dL — ABNORMAL HIGH (ref 70–99)
Potassium: 4.3 mEq/L (ref 3.5–5.1)
SODIUM: 140 meq/L (ref 135–145)

## 2014-08-21 LAB — LIPID PANEL
Cholesterol: 161 mg/dL (ref 0–200)
HDL: 52.4 mg/dL (ref >39.00)
LDL Cholesterol: 91 mg/dL (ref 0–99)
NONHDL: 108.6
Total CHOL/HDL Ratio: 3
Triglycerides: 90 mg/dL (ref 0.0–149.0)
VLDL: 18 mg/dL (ref 0.0–40.0)

## 2014-08-21 LAB — HEMOGLOBIN A1C: Hgb A1c MFr Bld: 6.3 % (ref 4.6–6.5)

## 2014-08-21 LAB — CBC
HCT: 37 % (ref 36.0–46.0)
HEMOGLOBIN: 12.4 g/dL (ref 12.0–15.0)
MCHC: 33.3 g/dL (ref 30.0–36.0)
MCV: 84.5 fl (ref 78.0–100.0)
Platelets: 216 10*3/uL (ref 150.0–400.0)
RBC: 4.38 Mil/uL (ref 3.87–5.11)
RDW: 12.9 % (ref 11.5–15.5)
WBC: 5.2 10*3/uL (ref 4.0–10.5)

## 2014-08-21 MED ORDER — HYDROCORTISONE 2 % EX LOTN: TOPICAL_LOTION | CUTANEOUS | Status: DC

## 2014-08-21 NOTE — Patient Instructions (Signed)
We will check your blood work today and call you with the results.  You can come back in one year, please feel free to call us sooner if you have any questions or problems.  Exercise to Lose Weight Exercise and a healthy diet may help you lose weight. Your doctor may suggest specific exercises. EXERCISE IDEAS AND TIPS  Choose low-cost things you enjoy doing, such as walking, bicycling, or exercising to workout videos.  Take stairs instead of the elevator.  Walk during your lunch break.  Park your car further away from work or school.  Go to a gym or an exercise class.  Start with 5 to 10 minutes of exercise each day. Build up to 30 minutes of exercise 4 to 6 days a week.  Wear shoes with good support and comfortable clothes.  Stretch before and after working out.  Work out until you breathe harder and your heart beats faster.  Drink extra water when you exercise.  Do not do so much that you hurt yourself, feel dizzy, or get very short of breath. Exercises that burn about 150 calories:  Running 1  miles in 15 minutes.  Playing volleyball for 45 to 60 minutes.  Washing and waxing a car for 45 to 60 minutes.  Playing touch football for 45 minutes.  Walking 1  miles in 35 minutes.  Pushing a stroller 1  miles in 30 minutes.  Playing basketball for 30 minutes.  Raking leaves for 30 minutes.  Bicycling 5 miles in 30 minutes.  Walking 2 miles in 30 minutes.  Dancing for 30 minutes.  Shoveling snow for 15 minutes.  Swimming laps for 20 minutes.  Walking up stairs for 15 minutes.  Bicycling 4 miles in 15 minutes.  Gardening for 30 to 45 minutes.  Jumping rope for 15 minutes.  Washing windows or floors for 45 to 60 minutes. Document Released: 10/30/2010 Document Revised: 12/20/2011 Document Reviewed: 10/30/2010 [facility] Patient Information 2015 [place], [place]. This information is not intended to replace advice given to you by your health care provider.  Make sure you discuss any questions you have with your health care provider.

## 2014-08-21 NOTE — Progress Notes (Signed)
   Subjective:    Patient ID: Teresa West, female    DOB: [birth year], 53 y.o.   MRN: [id number]  HPI The patient is a 53 year old female with no significant past medical history. She states she got colonoscopy in 2011, they found polyps and wishes advised to return in 5 years which would be 2016. She denies any chest pains, shortness of breath, abdominal pain. She is trying to lose weight and is starting [employer] at work next week. 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  Review of Systems  Constitutional: Negative for activity change, appetite change, fatigue and unexpected weight change.  HENT: Negative.   Respiratory: Negative for cough, chest tightness, shortness of breath and wheezing.   Cardiovascular: Negative for chest pain, palpitations and leg swelling.  Gastrointestinal: Negative for vomiting, abdominal pain, diarrhea, constipation and abdominal distention.  Musculoskeletal: Negative.   Skin: Negative.   Neurological: Negative.   Psychiatric/Behavioral: Negative.       Objective:   Physical Exam  Constitutional: She is oriented to person, place, and time. She appears well-developed and well-nourished.  HENT:  Head: Normocephalic and atraumatic.  Eyes: EOM are normal.  Neck: Normal range of motion.  Cardiovascular: Regular rhythm.   No murmur heard. Pulmonary/Chest: Effort normal and breath sounds normal. No respiratory distress. She has no wheezes. She has no rales.  Abdominal: Soft. Bowel sounds are normal. She exhibits no distension. There is no tenderness. There is no rebound.  Musculoskeletal: She exhibits no edema or tenderness.  Neurological: She is alert and oriented to person, place, and time. Coordination normal.  Skin: Skin is warm and dry.   Filed Vitals:   08/21/14 1022  BP: 120/78  Pulse: 96  Temp: 98.3 F (36.8 C)  TempSrc: Oral  Resp: 14    Height: 5\' 6"  (1.676 m)  Weight: 223 lb 6.4 oz (101.334 kg)  SpO2: 97%      Assessment & Plan:

## 2014-08-21 NOTE — Progress Notes (Signed)
Pre visit review using our clinic review tool, if applicable. No additional management support is needed unless otherwise documented below in the visit note. 

## 2014-08-22 DIAGNOSIS — Z0000 Encounter for general adult medical examination without abnormal findings: Secondary | ICD-10-CM | POA: Insufficient documentation

## 2014-08-22 DIAGNOSIS — R238 Other skin changes: Secondary | ICD-10-CM | POA: Insufficient documentation

## 2014-08-22 DIAGNOSIS — E669 Obesity, unspecified: Secondary | ICD-10-CM | POA: Insufficient documentation

## 2014-08-22 NOTE — Assessment & Plan Note (Signed)
Likely some irritation from clothes, skin folds. She can try hydrocortisone on the area to stop the itching.

## 2014-08-22 NOTE — Assessment & Plan Note (Signed)
Patient denies current signs of blood loss, recent colonoscopy normal. Will check CBC to see if she still has anemia.

## 2014-08-22 NOTE — Assessment & Plan Note (Addendum)
Patient up-to-date on colonoscopy, mammogram. She has had flu shot already this season. Declines tetanus shot today. Up-to-date on Pap smear. Check lipid panel, hemoglobin A1c, CBC, BMP.

## 2014-08-22 NOTE — Assessment & Plan Note (Signed)
Counseled patient on diet and exercise. She does plan to start Weight [employer] next week. Advised her that exercise is an important component and that diet may not be enough for her to lose weight.

## 2014-08-22 NOTE — Assessment & Plan Note (Signed)
Did not hear a click on exam today. We'll continue to monitor in the future. No further evaluation needed at this time.

## 2014-12-04 ENCOUNTER — Other Ambulatory Visit: Payer: Self-pay

## 2014-12-04 DIAGNOSIS — Z1231 Encounter for screening mammogram for malignant neoplasm of breast: Secondary | ICD-10-CM

## 2014-12-09 ENCOUNTER — Encounter (INDEPENDENT_AMBULATORY_CARE_PROVIDER_SITE_OTHER): Payer: Self-pay

## 2014-12-09 ENCOUNTER — Ambulatory Visit
Admission: RE | Admit: 2014-12-09 | Discharge: 2014-12-09 | Disposition: A | Discharge status: Home / Self Care | Payer: No Typology Code available for payment source | Source: Ambulatory Visit

## 2014-12-09 DIAGNOSIS — Z1231 Encounter for screening mammogram for malignant neoplasm of breast: Secondary | ICD-10-CM

## 2015-08-26 ENCOUNTER — Ambulatory Visit: Payer: No Typology Code available for payment source | Admitting: Internal Medicine

## 2015-11-04 ENCOUNTER — Other Ambulatory Visit: Payer: Self-pay

## 2015-11-04 DIAGNOSIS — Z1231 Encounter for screening mammogram for malignant neoplasm of breast: Secondary | ICD-10-CM

## 2015-12-10 ENCOUNTER — Ambulatory Visit
Admission: RE | Admit: 2015-12-10 | Discharge: 2015-12-10 | Disposition: A | Discharge status: Home / Self Care | Payer: Managed Care, Other (non HMO) | Source: Ambulatory Visit

## 2015-12-10 DIAGNOSIS — Z1231 Encounter for screening mammogram for malignant neoplasm of breast: Secondary | ICD-10-CM

## 2016-02-26 ENCOUNTER — Ambulatory Visit (HOSPITAL_COMMUNITY)
Admission: EM | Admit: 2016-02-26 | Discharge: 2016-02-26 | Disposition: A | Discharge status: Home / Self Care | Payer: Managed Care, Other (non HMO) | Attending: Emergency Medicine | Admitting: Emergency Medicine

## 2016-02-26 ENCOUNTER — Encounter (HOSPITAL_COMMUNITY): Payer: Self-pay | Admitting: Emergency Medicine

## 2016-02-26 DIAGNOSIS — R21 Rash and other nonspecific skin eruption: Secondary | ICD-10-CM

## 2016-02-26 MED ORDER — PREDNISONE 10 MG PO TABS: 20.0000 mg | ORAL_TABLET | Freq: Every day | ORAL | Status: DC

## 2016-02-26 NOTE — ED Notes (Signed)
Pt has small red bumps on BLE, her torso and a few on her arms.  She denies any issues, other than itching.

## 2016-02-26 NOTE — ED Provider Notes (Signed)
CSN: [id number]     Arrival date & time 02/26/16  1257 History   First MD Initiated Contact with Patient 02/26/16 1305     Chief Complaint  Patient presents with  . Rash   (Consider location/radiation/quality/duration/timing/severity/associated sxs/prior Treatment) HPI History obtained from patient:  Pt presents with the cc of:Insect bites itching, redness Duration of symptoms:Since Saturday Treatment prior to arrival:Benadryl Context:Onset Saturday after being outside. Unsure what may have gotten on her. Other symptoms include:Itching Pain score:2 FAMILY HISTORY:Diabetes and hypertension and mother    Past Medical History  Diagnosis Date  . Allergy   . Anemia   . Blood transfusion without reported diagnosis   . Mitral valve prolapse    Past Surgical History  Procedure Laterality Date  . Tubal ligation  1984  . Rotator cuff repair    . Appendectomy     Family History  Problem Relation Age of Onset  . Diabetes Mother   . Hypertension Mother    Social History  Substance Use Topics  . Smoking status: Never Smoker   . Smokeless tobacco: None  . Alcohol Use: No   OB History    No data available     Review of Systems  Denies: HEADACHE, NAUSEA, ABDOMINAL PAIN, CHEST PAIN, CONGESTION, DYSURIA, SHORTNESS OF BREATH  Allergies  Naproxen  Home Medications   Prior to Admission medications   Medication Sig Start Date End Date Taking? Authorizing Provider  HYDROCORTISONE, TOPICAL, 2 % LOTN Apply daily as needed to area of irritation 08/21/14   Myrlene BrokerElizabeth A Crawford, MD   Meds Ordered and Administered this Visit  Medications - No data to display  BP 111/75 mmHg  Pulse 69  Temp(Src) 98.5 F (36.9 C) (Oral)  Resp 16  SpO2 99%  LMP 01/05/2011 No data found.   Physical Exam NURSES NOTES AND VITAL SIGNS REVIEWED. CONSTITUTIONAL: Well developed, well nourished, no acute distress HEENT: normocephalic, atraumatic EYES: Conjunctiva normal NECK:normal ROM, supple,  no adenopathy PULMONARY:No respiratory distress, normal effort ABDOMINAL: Soft, ND, NT BS+, No CVAT MUSCULOSKELETAL: Normal ROM of all extremities,  SKIN: warm and dry, insect bites to both inner thighs abdomen and low back. Signs of cellulitis. PSYCHIATRIC: Mood and affect, behavior are normal  ED Course  Procedures (including critical care time)  Labs Review Labs Reviewed - No data to display  Imaging Review No results found.   Visual Acuity Review  Right Eye Distance:   Left Eye Distance:   Bilateral Distance:    Right Eye Near:   Left Eye Near:    Bilateral Near:      Prescription for prednisone.   MDM   1. Rash and nonspecific skin eruption     Patient is reassured that there are no issues that require transfer to higher level of care at this time or additional tests. Patient is advised to continue home symptomatic treatment. Patient is advised that if there are new or worsening symptoms to attend the emergency department, contact primary care provider, or return to [facility]. Instructions of care provided discharged home in stable condition.    THIS NOTE WAS GENERATED USING A VOICE RECOGNITION SOFTWARE PROGRAM. ALL REASONABLE EFFORTS  WERE MADE TO PROOFREAD THIS DOCUMENT FOR ACCURACY.  I have verbally reviewed the discharge instructions with the patient. A printed AVS was given to the patient.  All questions were answered prior to discharge.      Tharon AquasFrank C Patrick, PA 02/26/16 1408

## 2016-02-26 NOTE — Discharge Instructions (Signed)
Insect Bite °Mosquitoes, flies, fleas, bedbugs, and other insects can bite. Insect bites are different from insect stings. The bite may be red, puffy (swollen), and itchy for 2 to 4 days. Most bites get better on their own. °HOME CARE  °· Do not scratch the bite. °· Keep the bite clean and dry. Wash the bite with soap and water every day, as told by your doctor. °· If directed, apply ice to the bite area. °¨ Put ice in a plastic bag. °¨ Place a towel between your skin and the bag. °¨ Leave the ice on for 20 minutes, 2-3 times per day. °· Follow instructions from your doctor about using medicated lotions or creams. These can help with itching. °· Apply or take over-the-counter and prescription medicines only as told by your doctor. °· If you were given an antibiotic medicine, use it as told by your doctor. Do not stop using the medicine even if your condition improves. °· Keep all follow-up visits as told by your doctor. This is important. °GET HELP IF: °· You have redness, swelling (inflammation), or pain near your bite that is getting worse. °· You have a fever. °GET HELP RIGHT AWAY IF:  °· You have joint pain.   °· You have fluid, blood, or pus coming from the bite area.   °· You have a headache. °· You have neck pain. °· You feel weaker than you normally do.   °· You have a rash.   °· You have chest pain. °· You have shortness of breath. °· You have stomach pain, feel sick to your stomach (nauseous), or throw up (vomit). °· You feel more tired or sleepy than you normally do. °  °This information is not intended to replace advice given to you by your health care provider. Make sure you discuss any questions you have with your health care provider. °  °Document Released: 09/24/2000 Document Revised: 06/18/2015 Document Reviewed: 02/12/2015 °Elsevier Interactive Patient Education ©2016 [employer] ° °

## 2016-03-09 ENCOUNTER — Encounter: Payer: Self-pay | Admitting: Internal Medicine

## 2016-03-09 ENCOUNTER — Other Ambulatory Visit (INDEPENDENT_AMBULATORY_CARE_PROVIDER_SITE_OTHER): Payer: Managed Care, Other (non HMO)

## 2016-03-09 ENCOUNTER — Ambulatory Visit (INDEPENDENT_AMBULATORY_CARE_PROVIDER_SITE_OTHER): Payer: Managed Care, Other (non HMO) | Admitting: Internal Medicine

## 2016-03-09 VITALS — BP 120/82 | HR 72 | Temp 99.2°F | Resp 16 | Ht 66.0 in | Wt 188.0 lb

## 2016-03-09 DIAGNOSIS — E669 Obesity, unspecified: Secondary | ICD-10-CM | POA: Diagnosis not present

## 2016-03-09 DIAGNOSIS — Z23 Encounter for immunization: Secondary | ICD-10-CM | POA: Diagnosis not present

## 2016-03-09 DIAGNOSIS — Z0000 Encounter for general adult medical examination without abnormal findings: Secondary | ICD-10-CM

## 2016-03-09 LAB — LIPID PANEL
CHOLESTEROL: 163 mg/dL (ref 0–200)
HDL: 71.2 mg/dL (ref >39.00)
LDL CALC: 74 mg/dL (ref 0–99)
NONHDL: 91.71
Total CHOL/HDL Ratio: 2
Triglycerides: 88 mg/dL (ref 0.0–149.0)
VLDL: 17.6 mg/dL (ref 0.0–40.0)

## 2016-03-09 LAB — COMPREHENSIVE METABOLIC PANEL
ALT: 16 U/L (ref 0–35)
AST: 17 U/L (ref 0–37)
Albumin: 4.6 g/dL (ref 3.5–5.2)
Alkaline Phosphatase: 84 U/L (ref 39–117)
BUN: 11 mg/dL (ref 6–23)
CO2: 30 meq/L (ref 19–32)
CREATININE: 0.65 mg/dL (ref 0.40–1.20)
Calcium: 9.8 mg/dL (ref 8.4–10.5)
Chloride: 102 mEq/L (ref 96–112)
GFR: 121.52 mL/min (ref >60.00)
Glucose, Bld: 89 mg/dL (ref 70–99)
Potassium: 4.4 mEq/L (ref 3.5–5.1)
SODIUM: 138 meq/L (ref 135–145)
Total Bilirubin: 0.5 mg/dL (ref 0.2–1.2)
Total Protein: 7.8 g/dL (ref 6.0–8.3)

## 2016-03-09 LAB — CBC
HEMATOCRIT: 37.9 % (ref 36.0–46.0)
HEMOGLOBIN: 12.6 g/dL (ref 12.0–15.0)
MCHC: 33.3 g/dL (ref 30.0–36.0)
MCV: 85.9 fl (ref 78.0–100.0)
Platelets: 226 10*3/uL (ref 150.0–400.0)
RBC: 4.41 Mil/uL (ref 3.87–5.11)
RDW: 12.9 % (ref 11.5–15.5)
WBC: 5.1 10*3/uL (ref 4.0–10.5)

## 2016-03-09 LAB — HEMOGLOBIN A1C: HEMOGLOBIN A1C: 6 % (ref 4.6–6.5)

## 2016-03-09 NOTE — Assessment & Plan Note (Signed)
Weight is down about 35 pounds since last visit and she is staying with weight watchers.

## 2016-03-09 NOTE — Assessment & Plan Note (Signed)
She is going to get her colonoscopy done soon, weight is down which is good. Checking labs and tdap given to update immunizations. Given screening recommendations.

## 2016-03-09 NOTE — Progress Notes (Signed)
Pre visit review using our clinic review tool, if applicable. No additional management support is needed unless otherwise documented below in the visit note. 

## 2016-03-09 NOTE — Progress Notes (Signed)
   Subjective:    Patient ID: Teresa West, female    DOB: 1961/08/07, 55 y.o.   MRN: [id number]  HPI The patient is a 55 YO female coming in for wellness. No new concerns. Overdue for repeat colonoscopy. Has been doing weight watchers and down 35 pounds since last visit.   [facility], [facility], social history reviewed and updated.   Review of Systems  Constitutional: Negative for activity change, appetite change, fatigue and unexpected weight change.  HENT: Negative.   Eyes: Negative.   Respiratory: Negative for cough, chest tightness, shortness of breath and wheezing.   Cardiovascular: Negative for chest pain, palpitations and leg swelling.  Gastrointestinal: Negative for vomiting, abdominal pain, diarrhea, constipation and abdominal distention.  Musculoskeletal: Negative.   Skin: Negative.   Neurological: Negative.   Psychiatric/Behavioral: Negative.       Objective:   Physical Exam  Constitutional: She is oriented to person, place, and time. She appears well-developed and well-nourished.  HENT:  Head: Normocephalic and atraumatic.  Eyes: EOM are normal.  Neck: Normal range of motion.  Cardiovascular: Regular rhythm.   No murmur heard. Pulmonary/Chest: Effort normal and breath sounds normal. No respiratory distress. She has no wheezes. She has no rales.  Abdominal: Soft. Bowel sounds are normal. She exhibits no distension. There is no tenderness. There is no rebound.  Musculoskeletal: She exhibits no edema or tenderness.  Neurological: She is alert and oriented to person, place, and time. Coordination normal.  Skin: Skin is warm and dry.   Filed Vitals:   03/09/16 1309  BP: 120/82  Pulse: 72  Temp: 99.2 F (37.3 C)  TempSrc: Oral  Resp: 16  Height: 5\' 6"  (1.676 m)  Weight: 188 lb (85.276 kg)  SpO2: 98%      Assessment & Plan:  Tdap given at visit.

## 2016-03-09 NOTE — Addendum Note (Signed)
Addended by: Conception ChancyOBERSON, Carleton [place] R on: 03/09/2016 02:48 PM   Modules accepted: Orders

## 2016-03-09 NOTE — Patient Instructions (Signed)
We are checking the blood work today and will send the results on mychart.   We have given you the tetanus shot today.   Remember to get the colon cancer screening done this year sometime.   Keep up the great work of maintaining your weight!  Health Maintenance, Female Adopting a healthy lifestyle and getting preventive care can go a long way to promote health and wellness. Talk with your health care provider about what schedule of regular examinations is right for you. This is a good chance for you to check in with your provider about disease prevention and staying healthy. In between checkups, there are plenty of things you can do on your own. Experts have done a lot of research about which lifestyle changes and preventive measures are most likely to keep you healthy. Ask your health care provider for more information. WEIGHT AND DIET  Eat a healthy diet  Be sure to include plenty of vegetables, fruits, low-fat dairy products, and lean protein.  Do not eat a lot of foods high in solid fats, added sugars, or salt.  Get regular exercise. This is one of the most important things you can do for your health.  Most adults should exercise for at least 150 minutes each week. The exercise should increase your heart rate and make you sweat (moderate-intensity exercise).  Most adults should also do strengthening exercises at least twice a week. This is in addition to the moderate-intensity exercise.  Maintain a healthy weight  Body mass index (BMI) is a measurement that can be used to identify possible weight problems. It estimates body fat based on height and weight. Your health care provider can help determine your BMI and help you achieve or maintain a healthy weight.  For females 60 years of age and older:   A BMI below 18.5 is considered underweight.  A BMI of 18.5 to 24.9 is normal.  A BMI of 25 to 29.9 is considered overweight.  A BMI of 30 and above is considered obese.  Watch  levels of cholesterol and blood lipids  You should start having your blood tested for lipids and cholesterol at 56 years of age, then have this test every 5 years.  You may need to have your cholesterol levels checked more often if:  Your lipid or cholesterol levels are high.  You are older than 56 years of age.  You are at high risk for heart disease.  CANCER SCREENING   Lung Cancer  Lung cancer screening is recommended for adults 5-32 years old who are at high risk for lung cancer because of a history of smoking.  A yearly low-dose CT scan of the lungs is recommended for people who:  Currently smoke.  Have quit within the past 15 years.  Have at least a 30-pack-year history of smoking. A pack year is smoking an average of one pack of cigarettes a day for 1 year.  Yearly screening should continue until it has been 15 years since you quit.  Yearly screening should stop if you develop a health problem that would prevent you from having lung cancer treatment.  Breast Cancer  Practice breast self-awareness. This means understanding how your breasts normally appear and feel.  It also means doing regular breast self-exams. Let your health care provider know about any changes, no matter how small.  If you are in your 20s or 30s, you should have a clinical breast exam (CBE) by a health care provider every 1-3 years as  part of a regular health exam.  If you are 40 or older, have a CBE every year. Also consider having a breast X-ray (mammogram) every year.  If you have a family history of breast cancer, talk to your health care provider about genetic screening.  If you are at high risk for breast cancer, talk to your health care provider about having an MRI and a mammogram every year.  Breast cancer gene (BRCA) assessment is recommended for women who have family members with BRCA-related cancers. BRCA-related cancers include:  Breast.  Ovarian.  Tubal.  Peritoneal  cancers.  Results of the assessment will determine the need for genetic counseling and BRCA1 and BRCA2 testing. Cervical Cancer Your health care provider may recommend that you be screened regularly for cancer of the pelvic organs (ovaries, uterus, and vagina). This screening involves a pelvic examination, including checking for microscopic changes to the surface of your cervix (Pap test). You may be encouraged to have this screening done every 3 years, beginning at age 70.  For women ages 87-65, health care providers may recommend pelvic exams and Pap testing every 3 years, or they may recommend the Pap and pelvic exam, combined with testing for human papilloma virus (HPV), every 5 years. Some types of HPV increase your risk of cervical cancer. Testing for HPV may also be done on women of any age with unclear Pap test results.  Other health care providers may not recommend any screening for nonpregnant women who are considered low risk for pelvic cancer and who do not have symptoms. Ask your health care provider if a screening pelvic exam is right for you.  If you have had past treatment for cervical cancer or a condition that could lead to cancer, you need Pap tests and screening for cancer for at least 20 years after your treatment. If Pap tests have been discontinued, your risk factors (such as having a new sexual partner) need to be reassessed to determine if screening should resume. Some women have medical problems that increase the chance of getting cervical cancer. In these cases, your health care provider may recommend more frequent screening and Pap tests. Colorectal Cancer  This type of cancer can be detected and often prevented.  Routine colorectal cancer screening usually begins at 56 years of age and continues through 56 years of age.  Your health care provider may recommend screening at an earlier age if you have risk factors for colon cancer.  Your health care provider may also  recommend using home test kits to check for hidden blood in the stool.  A small camera at the end of a tube can be used to examine your colon directly (sigmoidoscopy or colonoscopy). This is done to check for the earliest forms of colorectal cancer.  Routine screening usually begins at age 41.  Direct examination of the colon should be repeated every 5-10 years through 56 years of age. However, you may need to be screened more often if early forms of precancerous polyps or small growths are found. Skin Cancer  Check your skin from head to toe regularly.  Tell your health care provider about any new moles or changes in moles, especially if there is a change in a mole's shape or color.  Also tell your health care provider if you have a mole that is larger than the size of a pencil eraser.  Always use sunscreen. Apply sunscreen liberally and repeatedly throughout the day.  Protect yourself by wearing long sleeves, pants,  a wide-brimmed hat, and sunglasses whenever you are outside. HEART DISEASE, DIABETES, AND HIGH BLOOD PRESSURE   High blood pressure causes heart disease and increases the risk of stroke. High blood pressure is more likely to develop in:  People who have blood pressure in the high end of the normal range (130-139/85-89 mm Hg).  People who are overweight or obese.  People who are African American.  If you are 60-80 years of age, have your blood pressure checked every 3-5 years. If you are 29 years of age or older, have your blood pressure checked every year. You should have your blood pressure measured twice--once when you are at a hospital or clinic, and once when you are not at a hospital or clinic. Record the average of the two measurements. To check your blood pressure when you are not at a hospital or clinic, you can use:  An automated blood pressure machine at a pharmacy.  A home blood pressure monitor.  If you are between 48 years and 52 years old, ask your health  care provider if you should take aspirin to prevent strokes.  Have regular diabetes screenings. This involves taking a blood sample to check your fasting blood sugar level.  If you are at a normal weight and have a low risk for diabetes, have this test once every three years after 56 years of age.  If you are overweight and have a high risk for diabetes, consider being tested at a younger age or more often. PREVENTING INFECTION  Hepatitis B  If you have a higher risk for hepatitis B, you should be screened for this virus. You are considered at high risk for hepatitis B if:  You were born in a country where hepatitis B is common. Ask your health care provider which countries are considered high risk.  Your parents were born in a high-risk country, and you have not been immunized against hepatitis B (hepatitis B vaccine).  You have HIV or AIDS.  You use needles to inject street drugs.  You live with someone who has hepatitis B.  You have had sex with someone who has hepatitis B.  You get hemodialysis treatment.  You take certain medicines for conditions, including cancer, organ transplantation, and autoimmune conditions. Hepatitis C  Blood testing is recommended for:  Everyone born from [birth year] through 1966.  Anyone with known risk factors for hepatitis C. Sexually transmitted infections (STIs)  You should be screened for sexually transmitted infections (STIs) including gonorrhea and chlamydia if:  You are sexually active and are younger than 56 years of age.  You are older than 56 years of age and your health care provider tells you that you are at risk for this type of infection.  Your sexual activity has changed since you were last screened and you are at an increased risk for chlamydia or gonorrhea. Ask your health care provider if you are at risk.  If you do not have HIV, but are at risk, it may be recommended that you take a prescription medicine daily to prevent HIV  infection. This is called pre-exposure prophylaxis (PrEP). You are considered at risk if:  You are sexually active and do not regularly use condoms or know the HIV status of your partner(s).  You take drugs by injection.  You are sexually active with a partner who has HIV. Talk with your health care provider about whether you are at high risk of being infected with HIV. If you choose to begin  PrEP, you should first be tested for HIV. You should then be tested every 3 months for as long as you are taking PrEP.  PREGNANCY   If you are premenopausal and you may become pregnant, ask your health care provider about preconception counseling.  If you may become pregnant, take 400 to 800 micrograms (mcg) of folic acid every day.  If you want to prevent pregnancy, talk to your health care provider about birth control (contraception). OSTEOPOROSIS AND MENOPAUSE   Osteoporosis is a disease in which the bones lose minerals and strength with aging. This can result in serious bone fractures. Your risk for osteoporosis can be identified using a bone density scan.  If you are 69 years of age or older, or if you are at risk for osteoporosis and fractures, ask your health care provider if you should be screened.  Ask your health care provider whether you should take a calcium or vitamin D supplement to lower your risk for osteoporosis.  Menopause may have certain physical symptoms and risks.  Hormone replacement therapy may reduce some of these symptoms and risks. Talk to your health care provider about whether hormone replacement therapy is right for you.  HOME CARE INSTRUCTIONS   Schedule regular health, dental, and eye exams.  Stay current with your immunizations.   Do not use any tobacco products including cigarettes, chewing tobacco, or electronic cigarettes.  If you are pregnant, do not drink alcohol.  If you are breastfeeding, limit how much and how often you drink alcohol.  Limit  alcohol intake to no more than 1 drink per day for nonpregnant women. One drink equals 12 ounces of beer, 5 ounces of wine, or 1 ounces of hard liquor.  Do not use street drugs.  Do not share needles.  Ask your health care provider for help if you need support or information about quitting drugs.  Tell your health care provider if you often feel depressed.  Tell your health care provider if you have ever been abused or do not feel safe at home.   This information is not intended to replace advice given to you by your health care provider. Make sure you discuss any questions you have with your health care provider.   Document Released: 04/12/2011 Document Revised: 10/18/2014 Document Reviewed: 08/29/2013 Elsevier Interactive Patient Education [employer].

## 2016-03-10 ENCOUNTER — Telehealth: Payer: Self-pay | Admitting: Internal Medicine

## 2016-03-10 DIAGNOSIS — Z1211 Encounter for screening for malignant neoplasm of colon: Secondary | ICD-10-CM

## 2016-03-10 LAB — HEPATITIS C ANTIBODY: HCV Ab: NEGATIVE

## 2016-03-10 NOTE — Telephone Encounter (Signed)
Patient states that she told dr Okey Duprecrawford that she was going to check with dr Patsi Searstannenbaum for colonoscopy. They advised her that she had to be a patient of the practice to do a colonoscopy. She is asking that we refer her upstairs instead. Please begin the process of having the patient referred to GI

## 2016-03-11 NOTE — Telephone Encounter (Signed)
Referral placed.

## 2016-03-12 ENCOUNTER — Encounter: Payer: Self-pay | Admitting: Internal Medicine

## 2016-03-12 DIAGNOSIS — Z86010 Personal history of colonic polyps: Secondary | ICD-10-CM

## 2016-04-27 ENCOUNTER — Encounter: Payer: Self-pay | Admitting: Internal Medicine

## 2016-06-02 ENCOUNTER — Other Ambulatory Visit: Payer: Self-pay | Admitting: Gastroenterology

## 2016-06-03 ENCOUNTER — Encounter (HOSPITAL_COMMUNITY): Payer: Self-pay | Admitting: *Deleted

## 2016-06-07 ENCOUNTER — Ambulatory Visit (HOSPITAL_COMMUNITY): Payer: Managed Care, Other (non HMO) | Admitting: Registered Nurse

## 2016-06-07 ENCOUNTER — Encounter (HOSPITAL_COMMUNITY): Payer: Self-pay

## 2016-06-07 ENCOUNTER — Ambulatory Visit (HOSPITAL_COMMUNITY)
Admission: RE | Admit: 2016-06-07 | Discharge: 2016-06-07 | Disposition: A | Discharge status: Home / Self Care | Payer: Managed Care, Other (non HMO) | Source: Ambulatory Visit | Attending: Gastroenterology | Admitting: Gastroenterology

## 2016-06-07 ENCOUNTER — Encounter (HOSPITAL_COMMUNITY)
Admission: RE | Disposition: A | Discharge status: Home / Self Care | Payer: Self-pay | Source: Ambulatory Visit | Attending: Gastroenterology

## 2016-06-07 DIAGNOSIS — Z86010 Personal history of colonic polyps: Secondary | ICD-10-CM | POA: Diagnosis not present

## 2016-06-07 DIAGNOSIS — Z800 Family history of malignant neoplasm of digestive organs: Secondary | ICD-10-CM | POA: Diagnosis not present

## 2016-06-07 DIAGNOSIS — Z1211 Encounter for screening for malignant neoplasm of colon: Secondary | ICD-10-CM | POA: Diagnosis present

## 2016-06-07 HISTORY — PX: COLONOSCOPY WITH PROPOFOL: SHX5780

## 2016-06-07 SURGERY — COLONOSCOPY
Anesthesia: IV Sedation (MBSC Only)

## 2016-06-07 SURGERY — COLONOSCOPY WITH PROPOFOL
Anesthesia: Monitor Anesthesia Care

## 2016-06-07 MED ORDER — LIDOCAINE HCL (CARDIAC) 20 MG/ML IV SOLN
INTRAVENOUS | Status: AC
  Filled 2016-06-07: qty 5

## 2016-06-07 MED ORDER — LACTATED RINGERS IV SOLN: INTRAVENOUS | Status: DC

## 2016-06-07 MED ORDER — PROPOFOL 10 MG/ML IV BOLUS
INTRAVENOUS | Status: AC
  Filled 2016-06-07: qty 40

## 2016-06-07 MED ORDER — PROPOFOL 10 MG/ML IV BOLUS
INTRAVENOUS | Status: DC | PRN
  Administered 2016-06-07 (×2): 20 mg via INTRAVENOUS

## 2016-06-07 MED ORDER — PROPOFOL 500 MG/50ML IV EMUL
INTRAVENOUS | Status: DC | PRN
  Administered 2016-06-07: 140 ug/kg/min via INTRAVENOUS

## 2016-06-07 MED ORDER — LACTATED RINGERS IV SOLN
INTRAVENOUS | Status: DC
  Administered 2016-06-07: 08:00:00 via INTRAVENOUS

## 2016-06-07 MED ORDER — SODIUM CHLORIDE 0.9 % IV SOLN: INTRAVENOUS | Status: DC

## 2016-06-07 MED ORDER — LIDOCAINE HCL (CARDIAC) 20 MG/ML IV SOLN
INTRAVENOUS | Status: DC | PRN
  Administered 2016-06-07: 100 mg via INTRAVENOUS

## 2016-06-07 SURGICAL SUPPLY — 22 items

## 2016-06-07 NOTE — Transfer of Care (Signed)
Immediate Anesthesia Transfer of Care Note  Patient: Duke SalviaBrenda M Leahy  Procedure(s) Performed: Procedure(s): COLONOSCOPY WITH PROPOFOL (N/A)  Patient Location: PACU and Endoscopy Unit  Anesthesia Type:MAC  Level of Consciousness: awake, alert , oriented and patient cooperative  Airway & Oxygen Therapy: Patient Spontanous Breathing and Patient connected to face mask oxygen  Post-op Assessment: Report given to RN, Post -op Vital signs reviewed and stable and Patient moving all extremities  Post vital signs: Reviewed and stable  Last Vitals:  Vitals:   06/07/16 0728  BP: (!) 122/50  Pulse: (!) 59  Resp: 16  Temp: 36.7 C    Last Pain:  Vitals:   06/07/16 0728  TempSrc: Oral         Complications: No apparent anesthesia complications

## 2016-06-07 NOTE — Anesthesia Preprocedure Evaluation (Addendum)
Anesthesia Evaluation  Patient identified by MRN, date of birth, ID band Patient awake    Reviewed: Allergy & Precautions, NPO status , Patient's Chart, lab work & pertinent test results  Airway Mallampati: II       Dental   Pulmonary neg pulmonary ROS,    breath sounds clear to auscultation       Cardiovascular negative cardio ROS   Rhythm:Regular     Neuro/Psych    GI/Hepatic Neg liver ROS, History noted. CE   Endo/Other  negative endocrine ROS  Renal/GU negative Renal ROS     Musculoskeletal   Abdominal   Peds  Hematology   Anesthesia Other Findings   Reproductive/Obstetrics                             Anesthesia Physical Anesthesia Plan  ASA: III  Anesthesia Plan: MAC   Post-op Pain Management:    Induction: Intravenous  Airway Management Planned: Simple Face Mask  Additional Equipment:   Intra-op Plan:   Post-operative Plan:   Informed Consent:   Dental advisory given  Plan Discussed with: CRNA and Anesthesiologist  Anesthesia Plan Comments:         Anesthesia Quick Evaluation

## 2016-06-07 NOTE — Anesthesia Postprocedure Evaluation (Signed)
Anesthesia Post Note  Patient: [name]  Procedure(s) Performed: Procedure(s) (LRB): COLONOSCOPY WITH PROPOFOL (N/A)  Patient location during evaluation: Endoscopy Level of consciousness: awake Pain management: pain level controlled Cardiovascular status: stable Anesthetic complications: no    Last Vitals:  Vitals:   06/07/16 0902 06/07/16 0910  BP: (!) 112/53 116/60  Pulse: 74   Resp: 14   Temp: 36.6 C     Last Pain:  Vitals:   06/07/16 0902  TempSrc: Oral                 EDWARDS,Modell Fendrick

## 2016-06-07 NOTE — Op Note (Signed)
[facility] Patient Name: Teresa West Procedure Date: 06/07/2016 MRN: [id number] Attending MD: Teresa West , MD Date of Birth: 1961-09-18 CSN: [id number] Age: 55 Admit Type: Outpatient Procedure:                Colonoscopy Indications:              High risk colon cancer surveillance: 07/12/2011                            colonoscopy was performed with removal of a 5 mm                            tubular adenomatous colon polyp. Brother was                            diagnosed with colon cancer at age 63. Providers:                Teresa Bumpers, MD, Omelia Blackwater RN, RN, Lorenda Ishihara, Technician, Lauree Chandler. Arm[place], CRNA Referring MD:              Medicines:                Propofol per Anesthesia Complications:            No immediate complications. Estimated Blood Loss:     Estimated blood loss: none. Procedure:                Pre-Anesthesia Assessment:                           - Prior to the procedure, a History and Physical                            was performed, and patient medications and                            allergies were reviewed. The patient's tolerance of                            previous anesthesia was also reviewed. The risks                            and benefits of the procedure and the sedation                            options and risks were discussed with the patient.                            All questions were answered, and informed consent                            was obtained. Prior Anticoagulants: The patient has  taken no previous anticoagulant or antiplatelet                            agents. ASA Grade Assessment: II - A patient with                            mild systemic disease. After reviewing the risks                            and benefits, the patient was deemed in                            satisfactory condition to undergo the procedure.            After obtaining informed consent, the colonoscope                            was passed under direct vision. Throughout the                            procedure, the patient's blood pressure, pulse, and                            oxygen saturations were monitored continuously. The                            EC-[id number] ([id number]) scope was introduced through                            the anus and advanced to the the cecum, identified                            by appendiceal orifice and ileocecal valve. The                            colonoscopy was somewhat difficult due to                            significant looping. The patient tolerated the                            procedure well. The quality of the bowel                            preparation was good. The appendiceal orifice and                            the rectum were photographed. Scope In: 8:33:01 AM Scope Out: 8:57:59 AM Scope Withdrawal Time: 0 hours 13 minutes 26 seconds  Total Procedure Duration: 0 hours 24 minutes 58 seconds  Findings:      The perianal and digital rectal examinations were normal.      The entire examined colon appeared normal. Impression:               - The entire  examined colon is normal.                           - No specimens collected. Moderate Sedation:      N/A- Per Anesthesia Care Recommendation:           - Patient has a contact number available for                            emergencies. The signs and symptoms of potential                            delayed complications were discussed with the                            patient. Return to normal activities tomorrow.                            Written discharge instructions were provided to the                            patient.                           - Repeat colonoscopy in 5 years for surveillance.                           - Resume previous diet.                           - Continue present medications. Procedure  Code(s):        --- Professional ---                           Z6109G0105, Colorectal cancer screening; colonoscopy on                            individual at high risk Diagnosis Code(s):        --- Professional ---                           Z86.010, Personal history of colonic polyps CPT copyright 2016 American Medical Association. All rights reserved. The codes documented in this report are preliminary and upon coder review may  be revised to meet current compliance requirements. Teresa EdgeMartin Shaheen Star, MD Teresa BumpersMartin K Erland Vivas, MD 06/07/2016 9:04:24 AM This report has been signed electronically. Number of Addenda: 0

## 2016-06-07 NOTE — H&P (Signed)
  Procedure: Surveillance colonoscopy. 07/12/2011 colonoscopy was performed with removal of a 5 mm tubular adenomatous colon polyp  History: The patient is a 55 year old female born 09/28/1961. She is scheduled to undergo a surveillance colonoscopy today  Medication allergies: Ibuprofen causes nausea  Past medical history: Appendectomy performed in 1996. Rotator cuff repair performed in March 2013. Seasonal allergies.  Exam: The patient is alert and lying comfortably on the endoscopy stretcher. Abdomen is soft and nontender to palpation. Lungs are clear to auscultation. Cardiac exam reveals a regular rhythm.  Plan: Proceed with surveillance colonoscopy

## 2016-06-07 NOTE — Discharge Instructions (Signed)

## 2016-06-09 ENCOUNTER — Encounter (HOSPITAL_COMMUNITY): Payer: Self-pay | Admitting: Gastroenterology

## 2016-12-09 ENCOUNTER — Encounter (HOSPITAL_COMMUNITY): Payer: Self-pay | Admitting: Emergency Medicine

## 2016-12-09 ENCOUNTER — Ambulatory Visit (HOSPITAL_COMMUNITY)
Admission: EM | Admit: 2016-12-09 | Discharge: 2016-12-09 | Disposition: A | Discharge status: Home / Self Care | Payer: Managed Care, Other (non HMO) | Attending: Internal Medicine | Admitting: Internal Medicine

## 2016-12-09 DIAGNOSIS — B9789 Other viral agents as the cause of diseases classified elsewhere: Secondary | ICD-10-CM | POA: Diagnosis not present

## 2016-12-09 DIAGNOSIS — J069 Acute upper respiratory infection, unspecified: Secondary | ICD-10-CM | POA: Diagnosis not present

## 2016-12-09 DIAGNOSIS — M940 Chondrocostal junction syndrome [Tietze]: Secondary | ICD-10-CM

## 2016-12-09 MED ORDER — BENZONATATE 100 MG PO CAPS: 100.0000 mg | ORAL_CAPSULE | Freq: Three times a day (TID) | ORAL | 0 refills | Status: DC

## 2016-12-09 MED ORDER — DICLOFENAC SODIUM 75 MG PO TBEC: 75.0000 mg | DELAYED_RELEASE_TABLET | Freq: Two times a day (BID) | ORAL | 0 refills | Status: DC

## 2016-12-09 NOTE — ED Provider Notes (Signed)
CSN: [id number]     Arrival date & time 12/09/16  1003 History   First MD Initiated Contact with Patient 12/09/16 1022     Chief Complaint  Patient presents with  . Back Pain   (Consider location/radiation/quality/duration/timing/severity/associated sxs/prior Treatment) 56 year old female presents with a 24-hour history of right sided chest wall pain, worse with movement, worse when she lays on her right side, and worse with deep inspiration. She reports 1 week history of cough, describes as dry hacking, nonproductive, with clear sputum. She further states she's had a lot of "phlegm", congestion, and sore throat. She denies history of fever, no nausea, no vomiting, no diarrhea, no muscle aches, no body aches, no headaches.   The history is provided by the patient.  Back Pain    Past Medical History:  Diagnosis Date  . Allergy   . Anemia   . Blood transfusion without reported diagnosis 1984   after childbirth  . Mitral valve prolapse    Past Surgical History:  Procedure Laterality Date  . APPENDECTOMY    . COLONOSCOPY WITH PROPOFOL N/A 06/07/2016   Procedure: COLONOSCOPY WITH PROPOFOL;  Surgeon: Charolett Bumpers, MD;  Location: [facility];  Service: Endoscopy;  Laterality: N/A;  . ROTATOR CUFF REPAIR Left   . TUBAL LIGATION  1984   Family History  Problem Relation Age of Onset  . Diabetes Mother   . Hypertension Mother    Social History  Substance Use Topics  . Smoking status: Never Smoker  . Smokeless tobacco: Never Used  . Alcohol use No   OB History    No data available     Review of Systems  Reason unable to perform ROS: as covered in HPI.  Musculoskeletal: Positive for back pain.  All other systems reviewed and are negative.   Allergies  Naproxen  Home Medications   Prior to Admission medications   Medication Sig Start Date End Date Taking? Authorizing Provider  benzonatate (TESSALON) 100 MG capsule Take 1 capsule (100 mg total) by mouth every 8 (eight)  hours. 12/09/16   Dorena Bodo, NP  diclofenac (VOLTAREN) 75 MG EC tablet Take 1 tablet (75 mg total) by mouth 2 (two) times daily. 12/09/16   Dorena Bodo, NP   Meds Ordered and Administered this Visit  Medications - No data to display  BP 111/66 (BP Location: Right Arm)   Pulse 65   Temp 98.4 F (36.9 C) (Oral)   Resp 18   LMP 01/05/2011   SpO2 98%  No data found.   Physical Exam  Constitutional: She is oriented to person, place, and time. She appears well-developed and well-nourished. She does not have a sickly appearance. She does not appear ill. No distress.  HENT:  Head: Normocephalic and atraumatic.  Right Ear: Tympanic membrane and external ear normal.  Left Ear: Tympanic membrane and external ear normal.  Nose: Nose normal. Right sinus exhibits no maxillary sinus tenderness and no frontal sinus tenderness. Left sinus exhibits no maxillary sinus tenderness and no frontal sinus tenderness.  Mouth/Throat: Uvula is midline, oropharynx is clear and moist and mucous membranes are normal. No oropharyngeal exudate. Tonsils are 1+ on the right. Tonsils are 1+ on the left. No tonsillar exudate.  Eyes: Pupils are equal, round, and reactive to light.  Neck: Normal range of motion. Neck supple. No JVD present.  Cardiovascular: Normal rate and regular rhythm.   Pulmonary/Chest: Effort normal and breath sounds normal. No respiratory distress. She has no wheezes. She exhibits tenderness (  Tenderness with palpation to the right axillary area.).  Abdominal: Soft. Bowel sounds are normal. She exhibits no distension. There is no tenderness. There is no guarding.  Lymphadenopathy:    She has no cervical adenopathy.  Neurological: She is alert and oriented to person, place, and time.  Skin: Skin is warm and dry. Capillary refill takes less than 2 seconds. She is not diaphoretic.  Psychiatric: She has a normal mood and affect.  Nursing note and vitals reviewed.   Urgent Care Course      Procedures (including critical care time)  Labs Review Labs Reviewed - No data to display  Imaging Review No results found.            MDM   1. Viral URI with cough   2. Costochondritis, acute    You most likely have a viral URI, I advise rest, plenty of fluids and management of symptoms with over the counter medicines. For symptoms you may take Tylenol as needed every 4-6 hours for body aches or fever, not to exceed 4,000 mg a day, Take mucinex or mucinex DM ever 12 hours with a full glass of water, you may use an inhaled steroid such as Flonase, 2 sprays each nostril once a day for congestion, or an antihistamine such as Claritin or Zyrtec once a day. For cough, I have prescribed a medication called Tessalon. Take 1 tablet every 8 hours as needed for your cough.   For your costochondritis, I have prescribed diclofenac. Take 1 tablet twice a day as needed for pain.  Should your symptoms worsen or fail to resolve, follow up with your primary care provider or return to clinic.       Dorena BodoLawrence Prosper Paff, NP 12/09/16 1038

## 2016-12-09 NOTE — Discharge Instructions (Signed)
You most likely have a viral URI, I advise rest, plenty of fluids and management of symptoms with over the counter medicines. For symptoms you may take Tylenol as needed every 4-6 hours for body aches or fever, not to exceed 4,000 mg a day, Take mucinex or mucinex DM ever 12 hours with a full glass of water, you may use an inhaled steroid such as Flonase, 2 sprays each nostril once a day for congestion, or an antihistamine such as Claritin or Zyrtec once a day. For cough, I have prescribed a medication called Tessalon. Take 1 tablet every 8 hours as needed for your cough.   For your costochondritis, I have prescribed diclofenac. Take 1 tablet twice a day as needed for pain.  Should your symptoms worsen or fail to resolve, follow up with your primary care provider or return to clinic.

## 2016-12-09 NOTE — ED Triage Notes (Signed)
See s/s.  Pain is "just there" with breathing.  Pain right mid-back when pain occurs.  Sometimes feels as if pain in right chest.

## 2017-05-02 ENCOUNTER — Other Ambulatory Visit: Payer: Self-pay | Admitting: Internal Medicine

## 2017-05-02 DIAGNOSIS — Z1231 Encounter for screening mammogram for malignant neoplasm of breast: Secondary | ICD-10-CM

## 2017-05-10 ENCOUNTER — Ambulatory Visit
Admission: RE | Admit: 2017-05-10 | Discharge: 2017-05-10 | Disposition: A | Discharge status: Home / Self Care | Payer: Managed Care, Other (non HMO) | Source: Ambulatory Visit | Attending: Internal Medicine | Admitting: Internal Medicine

## 2017-05-10 DIAGNOSIS — Z1231 Encounter for screening mammogram for malignant neoplasm of breast: Secondary | ICD-10-CM

## 2017-07-18 ENCOUNTER — Ambulatory Visit (HOSPITAL_COMMUNITY)
Admission: EM | Admit: 2017-07-18 | Discharge: 2017-07-18 | Disposition: A | Discharge status: Home / Self Care | Payer: 59 | Attending: Family Medicine | Admitting: Family Medicine

## 2017-07-18 ENCOUNTER — Encounter (HOSPITAL_COMMUNITY): Payer: Self-pay | Admitting: *Deleted

## 2017-07-18 DIAGNOSIS — T7840XA Allergy, unspecified, initial encounter: Secondary | ICD-10-CM | POA: Diagnosis not present

## 2017-07-18 MED ORDER — METHYLPREDNISOLONE 4 MG PO TBPK: ORAL_TABLET | ORAL | 0 refills | Status: DC

## 2017-07-18 MED ORDER — HYDROXYZINE HCL 25 MG PO TABS: 25.0000 mg | ORAL_TABLET | Freq: Three times a day (TID) | ORAL | 0 refills | Status: DC | PRN

## 2017-07-18 NOTE — ED Provider Notes (Signed)
[facility]    CSN: [id number] Arrival date & time: 07/18/17  1210     History   Chief Complaint Chief Complaint  Patient presents with  . Allergic Reaction    HPI Teresa West is a 56 y.o. female.   Patient is a 56 yo F who presents to urgent care with c/o face swelling. States she was working outside on Saturday pulling weeds when she noticed that started having itching of her L eye. She states she might have rubbed it at the time but does not specifically recall any trauma or injury. She subsequently noticed that her L eye and L side of her face started swelling. She felt like this was worsening and also noticed some lip swelling so she took 2 benadryl last night with some relief. This morning she felt like was starting to have more swelling around her R eye as well. Has had no throat swelling, SOB, abd pain, n/v. She denies any allergies to drugs or environmental, states her reaction to naproxen is more an intolerance. Denies any insect bites.      Past Medical History:  Diagnosis Date  . Allergy   . Anemia   . Blood transfusion without reported diagnosis 1984   after childbirth  . Mitral valve prolapse     Patient Active Problem List   Diagnosis Date Noted  . Obesity 08/22/2014  . Routine general medical examination at a health care facility 08/22/2014  . Anemia 12/08/2006  . Mitral valve prolapse 12/08/2006    Past Surgical History:  Procedure Laterality Date  . APPENDECTOMY    . COLONOSCOPY WITH PROPOFOL N/A 06/07/2016   Procedure: COLONOSCOPY WITH PROPOFOL;  Surgeon: Charolett Bumpers, MD;  Location: [facility];  Service: Endoscopy;  Laterality: N/A;  . ROTATOR CUFF REPAIR Left   . TUBAL LIGATION  1984    OB History    No data available       Home Medications    Prior to Admission medications   Medication Sig Start Date End Date Taking? Authorizing Provider  benzonatate (TESSALON) 100 MG capsule Take 1 capsule (100 mg total) by mouth  every 8 (eight) hours. 12/09/16   Dorena Bodo, NP  diclofenac (VOLTAREN) 75 MG EC tablet Take 1 tablet (75 mg total) by mouth 2 (two) times daily. 12/09/16   Dorena Bodo, NP    Family History Family History  Problem Relation Age of Onset  . Diabetes Mother   . Hypertension Mother     Social History Social History  Substance Use Topics  . Smoking status: Never Smoker  . Smokeless tobacco: Never Used  . Alcohol use No     Allergies   Naproxen   Review of Systems Review of Systems  Constitutional: Negative for chills and fever.  HENT: Positive for facial swelling. Negative for congestion, trouble swallowing and voice change.   Eyes: Positive for itching. Negative for photophobia, pain, discharge, redness and visual disturbance.  Respiratory: Negative for apnea, choking, chest tightness, shortness of breath, wheezing and stridor.   Cardiovascular: Negative for chest pain.  Gastrointestinal: Negative for abdominal pain, constipation, diarrhea, nausea and vomiting.  Genitourinary: Negative for difficulty urinating.  Skin: Negative for color change, rash and wound.  Neurological: Negative for speech difficulty, light-headedness and headaches.     Physical Exam Triage Vital Signs ED Triage Vitals [07/18/17 1342]  Enc Vitals Group     BP 124/74     Pulse Rate 65     Resp  16     Temp 98.6 F (37 C)     Temp Source Oral     SpO2 100 %     Weight      Height      Head Circumference      Peak Flow      Pain Score      Pain Loc      Pain Edu?      Excl. in GC?    No data found.   Updated Vital Signs BP 124/74 (BP Location: Left Arm)   Pulse 65   Temp 98.6 F (37 C) (Oral)   Resp 16   LMP 01/05/2011   SpO2 100%   Visual Acuity Right Eye Distance:   Left Eye Distance:   Bilateral Distance:    Right Eye Near:   Left Eye Near:    Bilateral Near:     Physical Exam  Constitutional: She is oriented to person, place, and time. She appears  well-developed and well-nourished. No distress.  HENT:  Head: Normocephalic. Head is without right periorbital erythema and without left periorbital erythema.  Right Ear: External ear normal.  Left Ear: External ear normal.  Nose: Nose normal.  Mouth/Throat: Oropharynx is clear and moist. No oropharyngeal exudate.  Periorbital edema around L eye particularly prominent on upper eyelid, extending across L side of face with some fullness of lips. No erythema or warmth.  Eyes: Pupils are equal, round, and reactive to light. EOM are normal. Right eye exhibits no discharge. Left eye exhibits no discharge. No scleral icterus.  Conjunctiva nonerythematous  Neck: Normal range of motion. Neck supple. No tracheal deviation present.  Cardiovascular: Normal rate, regular rhythm and normal heart sounds.   No murmur heard. Pulmonary/Chest: Effort normal and breath sounds normal. No respiratory distress. She has no wheezes.  Abdominal: Soft. Bowel sounds are normal. She exhibits no distension. There is no tenderness. There is no rebound and no guarding.  Musculoskeletal: Normal range of motion.  Lymphadenopathy:    She has no cervical adenopathy.  Neurological: She is alert and oriented to person, place, and time. She exhibits normal muscle tone.  Skin: Skin is warm and dry. Capillary refill takes less than 2 seconds. No rash noted. No erythema.  Psychiatric: She has a normal mood and affect.     UC Treatments / Results  Labs (all labs ordered are listed, but only abnormal results are displayed) Labs Reviewed - No data to display  EKG  EKG Interpretation None       Radiology No results found.  Procedures Procedures (including critical care time)  Medications Ordered in UC Medications - No data to display   Initial Impression / Assessment and Plan / UC Course  I have reviewed the triage vital signs and the nursing notes.  Pertinent labs & imaging results that were available during my  care of the patient were reviewed by me and considered in my medical decision making (see chart for details).   Patient is a 56yo F who presented to urgent care with c/o facial swelling most likely due to an allergic reaction to unknown trigger. She has no respiratory involvement and does not meet criteria for anaphylaxis. Also unlikely is cellulitis since she is afebrile and has no erythema or warmth. Advised treatment with antihistamines and steroids but patient very reluctant to start steroids. She stated that she preferred to try antihistamine first and would start steroids only if no improvement in next 1-2 days. Advised that  if she had any acutely worsening or difficulty breathing that she should head to nearest ER. Also recommended that if she should start steroids and no improvement then should return for recheck. She voiced good understanding of these return precautions and all questions were answered.   Final Clinical Impressions(s) / UC Diagnoses   Final diagnoses:  Allergic reaction, initial encounter    New Prescriptions Discharge Medication List as of 07/18/2017  2:08 PM    START taking these medications   Details  hydrOXYzine (ATARAX/VISTARIL) 25 MG tablet Take 1 tablet (25 mg total) by mouth 3 (three) times daily as needed., Starting Mon 07/18/2017, Normal    methylPREDNISolone (MEDROL DOSEPAK) 4 MG TBPK tablet As directed, Normal         Leland Her, DO 07/18/17 1430

## 2017-07-18 NOTE — Discharge Instructions (Signed)
Please take antihistamine and steroid medications. If no improvement 1-2 days after starting medications please return for recheck. If acutely worsening with difficulty breathing, then head to ER.

## 2017-07-18 NOTE — ED Triage Notes (Signed)
Patient reports after working in yard on Saturday developed swelling and itching to bilateral eyes. States she feels like left side of face is swollen. Patient has taken benadryl.

## 2017-07-26 ENCOUNTER — Ambulatory Visit (INDEPENDENT_AMBULATORY_CARE_PROVIDER_SITE_OTHER): Payer: 59 | Admitting: Nurse Practitioner

## 2017-07-26 ENCOUNTER — Encounter: Payer: Self-pay | Admitting: Nurse Practitioner

## 2017-07-26 VITALS — BP 112/76 | HR 89 | Temp 98.7°F | Ht 66.0 in | Wt 199.0 lb

## 2017-07-26 DIAGNOSIS — N3001 Acute cystitis with hematuria: Secondary | ICD-10-CM | POA: Diagnosis not present

## 2017-07-26 DIAGNOSIS — M545 Low back pain, unspecified: Secondary | ICD-10-CM

## 2017-07-26 DIAGNOSIS — L247 Irritant contact dermatitis due to plants, except food: Secondary | ICD-10-CM

## 2017-07-26 LAB — POCT URINALYSIS DIPSTICK
BILIRUBIN UA: NEGATIVE
GLUCOSE UA: NEGATIVE
KETONES UA: NEGATIVE
NITRITE UA: NEGATIVE
Protein, UA: NEGATIVE
Spec Grav, UA: 1.025 (ref 1.010–1.025)
Urobilinogen, UA: 1 E.U./dL
pH, UA: 6 (ref 5.0–8.0)

## 2017-07-26 MED ORDER — KETOROLAC TROMETHAMINE 30 MG/ML IJ SOLN
30.0000 mg | Freq: Once | INTRAMUSCULAR | Status: AC
  Administered 2017-07-26: 30 mg via INTRAMUSCULAR

## 2017-07-26 MED ORDER — ACETAMINOPHEN 500 MG PO TABS: 1000.0000 mg | ORAL_TABLET | Freq: Three times a day (TID) | ORAL | 0 refills | Status: DC | PRN

## 2017-07-26 MED ORDER — TRIAMCINOLONE ACETONIDE 0.5 % EX OINT: 1.0000 "application " | TOPICAL_OINTMENT | Freq: Two times a day (BID) | CUTANEOUS | 0 refills | Status: DC

## 2017-07-26 MED ORDER — CIPROFLOXACIN HCL 500 MG PO TABS: 500.0000 mg | ORAL_TABLET | Freq: Two times a day (BID) | ORAL | 0 refills | Status: DC

## 2017-07-26 NOTE — Patient Instructions (Signed)
Push oral hydration.  Call office if no improvement by Friday.

## 2017-07-26 NOTE — Progress Notes (Signed)
Subjective:  Patient ID: Teresa West, female    DOB: [birth year]  Age: 56 y.o. MRN: [id number]  CC: Back Pain (left lower back/side pain--going on for 2 days. ) and Rash (rash on hand and neck area--going on for 2 days. )   Back Pain  This is a new problem. Episode onset: 2days ago. The problem occurs constantly. The problem is unchanged. The pain is present in the lumbar spine. The quality of the pain is described as aching. The pain does not radiate. The symptoms are aggravated by twisting. Stiffness is present all day. Pertinent negatives include no abdominal pain, bladder incontinence, bowel incontinence, chest pain, dysuria, fever, headaches, leg pain, numbness, paresis, paresthesias, pelvic pain, perianal numbness, tingling, weakness or weight loss. (Increased urinary frequency) Risk factors include obesity, menopause and poor posture. She has tried muscle relaxant for the symptoms. The treatment provided no relief.  Rash  This is a new problem. The current episode started in the past 7 days. The problem has been gradually improving since onset. The affected locations include the neck, left hand and right hand. The rash is characterized by redness and itchiness. She was exposed to plant contact. Pertinent negatives include no anorexia, fatigue, fever or shortness of breath. Past treatments include antihistamine. The treatment provided mild relief.    Outpatient Medications Prior to Visit  Medication Sig Dispense Refill  . hydrOXYzine (ATARAX/VISTARIL) 25 MG tablet Take 1 tablet (25 mg total) by mouth 3 (three) times daily as needed. (Patient not taking: Reported on 07/26/2017) 30 tablet 0  . methylPREDNISolone (MEDROL DOSEPAK) 4 MG TBPK tablet As directed (Patient not taking: Reported on 07/26/2017) 21 tablet 0   No facility-administered medications prior to visit.     ROS See HPI  Objective:  BP 112/76   Pulse 89   Temp 98.7 F (37.1 C)   Ht  (1.676 m)   Wt 199 lb (90.3  kg)   LMP 01/05/2011   SpO2 99%   BMI 32.12 kg/m   BP Readings from Last 3 Encounters:  07/26/17 112/76  07/18/17 124/74  12/09/16 111/66    Wt Readings from Last 3 Encounters:  07/26/17 199 lb (90.3 kg)  06/07/16 188 lb (85.3 kg)  03/09/16 188 lb (85.3 kg)    Physical Exam  Constitutional: She is oriented to person, place, and time. No distress.  Neck: Normal range of motion. Neck supple.  Cardiovascular: Normal rate.   Pulmonary/Chest: Effort normal and breath sounds normal.  Abdominal: Soft. Bowel sounds are normal. She exhibits no distension. There is no tenderness.  No CVA tenderness  Musculoskeletal: Normal range of motion. She exhibits no edema or tenderness.  Lymphadenopathy:    She has no cervical adenopathy.  Neurological: She is alert and oriented to person, place, and time.  Skin: Skin is warm and dry. Rash noted.  Rash on dorsal surface of both hands and left side of neck.  Psychiatric: She has a normal mood and affect. Her behavior is normal.  Vitals reviewed.   Lab Results  Component Value Date   WBC 5.1 03/09/2016   HGB 12.6 03/09/2016   HCT 37.9 03/09/2016   PLT 226.0 03/09/2016   GLUCOSE 89 03/09/2016   CHOL 163 03/09/2016   TRIG 88.0 03/09/2016   HDL 71.20 03/09/2016   LDLCALC 74 03/09/2016   ALT 16 03/09/2016   AST 17 03/09/2016   NA 138 03/09/2016   K 4.4 03/09/2016   CL 102 03/09/2016   CREATININE  0.65 03/09/2016   BUN 11 03/09/2016   CO2 30 03/09/2016   HGBA1C 6.0 03/09/2016     Assessment & Plan:   Kiira was seen today for back pain and rash.  Diagnoses and all orders for this visit:  Acute left-sided low back pain without sciatica -     POCT urinalysis dipstick -     ciprofloxacin (CIPRO) 500 MG tablet; Take 1 tablet (500 mg total) by mouth 2 (two) times daily. -     acetaminophen (TYLENOL) 500 MG tablet; Take 2 tablets (1,000 mg total) by mouth every 8 (eight) hours as needed. -     ketorolac (TORADOL) 30 MG/ML injection  30 mg; Inject 1 mL (30 mg total) into the muscle once.  Acute cystitis with hematuria -     POCT urinalysis dipstick -     ciprofloxacin (CIPRO) 500 MG tablet; Take 1 tablet (500 mg total) by mouth 2 (two) times daily. -     acetaminophen (TYLENOL) 500 MG tablet; Take 2 tablets (1,000 mg total) by mouth every 8 (eight) hours as needed.  Irritant contact dermatitis due to plants, except food -     triamcinolone ointment (KENALOG) 0.5 %; Apply 1 application topically 2 (two) times daily.   I have discontinued Ms. Sendejo's methylPREDNISolone and hydrOXYzine. I am also having her start on triamcinolone ointment, ciprofloxacin, and acetaminophen. We administered ketorolac.  Meds ordered this encounter  Medications  . triamcinolone ointment (KENALOG) 0.5 %    Sig: Apply 1 application topically 2 (two) times daily.    Dispense:  30 g    Refill:  0    Order Specific Question:   Supervising Provider    Answer:   Pincus Sanes [[id number]]  . ciprofloxacin (CIPRO) 500 MG tablet    Sig: Take 1 tablet (500 mg total) by mouth 2 (two) times daily.    Dispense:  10 tablet    Refill:  0    Order Specific Question:   Supervising Provider    Answer:   Pincus Sanes [[id number]]  . acetaminophen (TYLENOL) 500 MG tablet    Sig: Take 2 tablets (1,000 mg total) by mouth every 8 (eight) hours as needed.    Dispense:  30 tablet    Refill:  0    Order Specific Question:   Supervising Provider    Answer:   Pincus Sanes [[id number]]  . ketorolac (TORADOL) 30 MG/ML injection 30 mg    Follow-up: No Follow-up on file.  Alysia Penna, NP

## 2017-08-01 ENCOUNTER — Encounter: Payer: Self-pay | Admitting: Internal Medicine

## 2017-08-01 ENCOUNTER — Ambulatory Visit (INDEPENDENT_AMBULATORY_CARE_PROVIDER_SITE_OTHER): Payer: 59 | Admitting: Internal Medicine

## 2017-08-01 ENCOUNTER — Other Ambulatory Visit (INDEPENDENT_AMBULATORY_CARE_PROVIDER_SITE_OTHER): Payer: 59

## 2017-08-01 VITALS — BP 110/70 | HR 75 | Temp 98.3°F | Ht 66.0 in | Wt 199.0 lb

## 2017-08-01 DIAGNOSIS — E6609 Other obesity due to excess calories: Secondary | ICD-10-CM | POA: Diagnosis not present

## 2017-08-01 DIAGNOSIS — Z0000 Encounter for general adult medical examination without abnormal findings: Secondary | ICD-10-CM

## 2017-08-01 LAB — COMPREHENSIVE METABOLIC PANEL
ALT: 20 U/L (ref 0–35)
AST: 18 U/L (ref 0–37)
Albumin: 3.9 g/dL (ref 3.5–5.2)
Alkaline Phosphatase: 82 U/L (ref 39–117)
BUN: 13 mg/dL (ref 6–23)
CHLORIDE: 103 meq/L (ref 96–112)
CO2: 30 mEq/L (ref 19–32)
Calcium: 9.3 mg/dL (ref 8.4–10.5)
Creatinine, Ser: 0.64 mg/dL (ref 0.40–1.20)
GFR: 123.09 mL/min (ref >60.00)
Glucose, Bld: 113 mg/dL — ABNORMAL HIGH (ref 70–99)
POTASSIUM: 3.9 meq/L (ref 3.5–5.1)
SODIUM: 140 meq/L (ref 135–145)
Total Bilirubin: 0.4 mg/dL (ref 0.2–1.2)
Total Protein: 7.2 g/dL (ref 6.0–8.3)

## 2017-08-01 LAB — URINALYSIS, ROUTINE W REFLEX MICROSCOPIC
Bilirubin Urine: NEGATIVE
KETONES UR: NEGATIVE
NITRITE: NEGATIVE
Total Protein, Urine: NEGATIVE
URINE GLUCOSE: NEGATIVE
Urobilinogen, UA: 0.2 (ref 0.0–1.0)
pH: 5.5 (ref 5.0–8.0)

## 2017-08-01 LAB — CBC
HEMATOCRIT: 36.8 % (ref 36.0–46.0)
HEMOGLOBIN: 11.9 g/dL — AB (ref 12.0–15.0)
MCHC: 32.4 g/dL (ref 30.0–36.0)
MCV: 87.8 fl (ref 78.0–100.0)
PLATELETS: 221 10*3/uL (ref 150.0–400.0)
RBC: 4.19 Mil/uL (ref 3.87–5.11)
RDW: 12.7 % (ref 11.5–15.5)
WBC: 4.5 10*3/uL (ref 4.0–10.5)

## 2017-08-01 LAB — LIPID PANEL
CHOL/HDL RATIO: 2
Cholesterol: 152 mg/dL (ref 0–200)
HDL: 68 mg/dL (ref >39.00)
LDL CALC: 73 mg/dL (ref 0–99)
NonHDL: 83.81
Triglycerides: 52 mg/dL (ref 0.0–149.0)
VLDL: 10.4 mg/dL (ref 0.0–40.0)

## 2017-08-01 LAB — HEMOGLOBIN A1C: HEMOGLOBIN A1C: 6.2 % (ref 4.6–6.5)

## 2017-08-01 NOTE — Progress Notes (Signed)
   Subjective:    Patient ID: Teresa West, female    DOB: 01/15/1961, 56 y.o.   MRN: [id number]  HPI The patient is a 56 YO female coming in for physical.   [facility], social history reviewed and updated.   Review of Systems  Constitutional: Negative.   HENT: Negative.   Eyes: Negative.   Respiratory: Negative for cough, chest tightness and shortness of breath.   Cardiovascular: Negative for chest pain, palpitations and leg swelling.  Gastrointestinal: Negative for abdominal distention, abdominal pain, constipation, diarrhea, nausea and vomiting.  Musculoskeletal: Negative.   Skin: Negative.   Neurological: Negative.   Psychiatric/Behavioral: Negative.       Objective:   Physical Exam  Constitutional: She is oriented to person, place, and time. She appears well-developed and well-nourished.  HENT:  Head: Normocephalic and atraumatic.  Eyes: EOM are normal.  Neck: Normal range of motion.  Cardiovascular: Normal rate and regular rhythm.   Pulmonary/Chest: Effort normal and breath sounds normal. No respiratory distress. She has no wheezes. She has no rales.  Abdominal: Soft. Bowel sounds are normal. She exhibits no distension. There is no tenderness. There is no rebound.  Musculoskeletal: She exhibits no edema.  Neurological: She is alert and oriented to person, place, and time. Coordination normal.  Skin: Skin is warm and dry.  Psychiatric: She has a normal mood and affect.   Vitals:   08/01/17 0856  BP: 110/70  Pulse: 75  Temp: 98.3 F (36.8 C)  TempSrc: Oral  SpO2: 100%  Weight: 199 lb (90.3 kg)  Height: 5\' 6"  (1.676 m)      Assessment & Plan:

## 2017-08-01 NOTE — Assessment & Plan Note (Signed)
Flu and tetanus up to date. Colonoscopy due in 2022. Pap smear with gyn. Counseled about shingrix and added to waiting list. Given screening recommendations.

## 2017-08-01 NOTE — Assessment & Plan Note (Signed)
Not exercising and she is counseled to add exercise and work on 10 pound weight loss in the next year.

## 2017-08-01 NOTE — Patient Instructions (Signed)
We would like you to work on exercise at least 3 times per week.   Health Maintenance, Female Adopting a healthy lifestyle and getting preventive care can go a long way to promote health and wellness. Talk with your health care provider about what schedule of regular examinations is right for you. This is a good chance for you to check in with your provider about disease prevention and staying healthy. In between checkups, there are plenty of things you can do on your own. Experts have done a lot of research about which lifestyle changes and preventive measures are most likely to keep you healthy. Ask your health care provider for more information. Weight and diet Eat a healthy diet  Be sure to include plenty of vegetables, fruits, low-fat dairy products, and lean protein.  Do not eat a lot of foods high in solid fats, added sugars, or salt.  Get regular exercise. This is one of the most important things you can do for your health. ? Most adults should exercise for at least 150 minutes each week. The exercise should increase your heart rate and make you sweat (moderate-intensity exercise). ? Most adults should also do strengthening exercises at least twice a week. This is in addition to the moderate-intensity exercise.  Maintain a healthy weight  Body mass index (BMI) is a measurement that can be used to identify possible weight problems. It estimates body fat based on height and weight. Your health care provider can help determine your BMI and help you achieve or maintain a healthy weight.  For females 39 years of age and older: ? A BMI below 18.5 is considered underweight. ? A BMI of 18.5 to 24.9 is normal. ? A BMI of 25 to 29.9 is considered overweight. ? A BMI of 30 and above is considered obese.  Watch levels of cholesterol and blood lipids  You should start having your blood tested for lipids and cholesterol at 57 years of age, then have this test every 5 years.  You may need to  have your cholesterol levels checked more often if: ? Your lipid or cholesterol levels are high. ? You are older than 57 years of age. ? You are at high risk for heart disease.  Cancer screening Lung Cancer  Lung cancer screening is recommended for adults 4-79 years old who are at high risk for lung cancer because of a history of smoking.  A yearly low-dose CT scan of the lungs is recommended for people who: ? Currently smoke. ? Have quit within the past 15 years. ? Have at least a 30-pack-year history of smoking. A pack year is smoking an average of one pack of cigarettes a day for 1 year.  Yearly screening should continue until it has been 15 years since you quit.  Yearly screening should stop if you develop a health problem that would prevent you from having lung cancer treatment.  Breast Cancer  Practice breast self-awareness. This means understanding how your breasts normally appear and feel.  It also means doing regular breast self-exams. Let your health care provider know about any changes, no matter how small.  If you are in your 20s or 30s, you should have a clinical breast exam (CBE) by a health care provider every 1-3 years as part of a regular health exam.  If you are 70 or older, have a CBE every year. Also consider having a breast X-ray (mammogram) every year.  If you have a family history of breast cancer,  talk to your health care provider about genetic screening.  If you are at high risk for breast cancer, talk to your health care provider about having an MRI and a mammogram every year.  Breast cancer gene (BRCA) assessment is recommended for women who have family members with BRCA-related cancers. BRCA-related cancers include: ? Breast. ? Ovarian. ? Tubal. ? Peritoneal cancers.  Results of the assessment will determine the need for genetic counseling and BRCA1 and BRCA2 testing.  Cervical Cancer Your health care provider may recommend that you be screened  regularly for cancer of the pelvic organs (ovaries, uterus, and vagina). This screening involves a pelvic examination, including checking for microscopic changes to the surface of your cervix (Pap test). You may be encouraged to have this screening done every 3 years, beginning at age 27.  For women ages 48-65, health care providers may recommend pelvic exams and Pap testing every 3 years, or they may recommend the Pap and pelvic exam, combined with testing for human papilloma virus (HPV), every 5 years. Some types of HPV increase your risk of cervical cancer. Testing for HPV may also be done on women of any age with unclear Pap test results.  Other health care providers may not recommend any screening for nonpregnant women who are considered low risk for pelvic cancer and who do not have symptoms. Ask your health care provider if a screening pelvic exam is right for you.  If you have had past treatment for cervical cancer or a condition that could lead to cancer, you need Pap tests and screening for cancer for at least 20 years after your treatment. If Pap tests have been discontinued, your risk factors (such as having a new sexual partner) need to be reassessed to determine if screening should resume. Some women have medical problems that increase the chance of getting cervical cancer. In these cases, your health care provider may recommend more frequent screening and Pap tests.  Colorectal Cancer  This type of cancer can be detected and often prevented.  Routine colorectal cancer screening usually begins at 57 years of age and continues through 57 years of age.  Your health care provider may recommend screening at an earlier age if you have risk factors for colon cancer.  Your health care provider may also recommend using home test kits to check for hidden blood in the stool.  A small camera at the end of a tube can be used to examine your colon directly (sigmoidoscopy or colonoscopy). This is  done to check for the earliest forms of colorectal cancer.  Routine screening usually begins at age 74.  Direct examination of the colon should be repeated every 5-10 years through 57 years of age. However, you may need to be screened more often if early forms of precancerous polyps or small growths are found.  Skin Cancer  Check your skin from head to toe regularly.  Tell your health care provider about any new moles or changes in moles, especially if there is a change in a mole's shape or color.  Also tell your health care provider if you have a mole that is larger than the size of a pencil eraser.  Always use sunscreen. Apply sunscreen liberally and repeatedly throughout the day.  Protect yourself by wearing long sleeves, pants, a wide-brimmed hat, and sunglasses whenever you are outside.  Heart disease, diabetes, and high blood pressure  High blood pressure causes heart disease and increases the risk of stroke. High blood pressure is  more likely to develop in: ? People who have blood pressure in the high end of the normal range (130-139/85-89 mm Hg). ? People who are overweight or obese. ? People who are African American.  If you are 90-6 years of age, have your blood pressure checked every 3-5 years. If you are 13 years of age or older, have your blood pressure checked every year. You should have your blood pressure measured twice-once when you are at a hospital or clinic, and once when you are not at a hospital or clinic. Record the average of the two measurements. To check your blood pressure when you are not at a hospital or clinic, you can use: ? An automated blood pressure machine at a pharmacy. ? A home blood pressure monitor.  If you are between 68 years and 32 years old, ask your health care provider if you should take aspirin to prevent strokes.  Have regular diabetes screenings. This involves taking a blood sample to check your fasting blood sugar level. ? If you are  at a normal weight and have a low risk for diabetes, have this test once every three years after 58 years of age. ? If you are overweight and have a high risk for diabetes, consider being tested at a younger age or more often. Preventing infection Hepatitis B  If you have a higher risk for hepatitis B, you should be screened for this virus. You are considered at high risk for hepatitis B if: ? You were born in a country where hepatitis B is common. Ask your health care provider which countries are considered high risk. ? Your parents were born in a high-risk country, and you have not been immunized against hepatitis B (hepatitis B vaccine). ? You have HIV or AIDS. ? You use needles to inject street drugs. ? You live with someone who has hepatitis B. ? You have had sex with someone who has hepatitis B. ? You get hemodialysis treatment. ? You take certain medicines for conditions, including cancer, organ transplantation, and autoimmune conditions.  Hepatitis C  Blood testing is recommended for: ? Everyone born from [birth year] through 1966. ? Anyone with known risk factors for hepatitis C.  Sexually transmitted infections (STIs)  You should be screened for sexually transmitted infections (STIs) including gonorrhea and chlamydia if: ? You are sexually active and are younger than 57 years of age. ? You are older than 57 years of age and your health care provider tells you that you are at risk for this type of infection. ? Your sexual activity has changed since you were last screened and you are at an increased risk for chlamydia or gonorrhea. Ask your health care provider if you are at risk.  If you do not have HIV, but are at risk, it may be recommended that you take a prescription medicine daily to prevent HIV infection. This is called pre-exposure prophylaxis (PrEP). You are considered at risk if: ? You are sexually active and do not regularly use condoms or know the HIV status of your  partner(s). ? You take drugs by injection. ? You are sexually active with a partner who has HIV.  Talk with your health care provider about whether you are at high risk of being infected with HIV. If you choose to begin PrEP, you should first be tested for HIV. You should then be tested every 3 months for as long as you are taking PrEP. Pregnancy  If you are premenopausal and you  may become pregnant, ask your health care provider about preconception counseling.  If you may become pregnant, take 400 to 800 micrograms (mcg) of folic acid every day.  If you want to prevent pregnancy, talk to your health care provider about birth control (contraception). Osteoporosis and menopause  Osteoporosis is a disease in which the bones lose minerals and strength with aging. This can result in serious bone fractures. Your risk for osteoporosis can be identified using a bone density scan.  If you are 59 years of age or older, or if you are at risk for osteoporosis and fractures, ask your health care provider if you should be screened.  Ask your health care provider whether you should take a calcium or vitamin D supplement to lower your risk for osteoporosis.  Menopause may have certain physical symptoms and risks.  Hormone replacement therapy may reduce some of these symptoms and risks. Talk to your health care provider about whether hormone replacement therapy is right for you. Follow these instructions at home:  Schedule regular health, dental, and eye exams.  Stay current with your immunizations.  Do not use any tobacco products including cigarettes, chewing tobacco, or electronic cigarettes.  If you are pregnant, do not drink alcohol.  If you are breastfeeding, limit how much and how often you drink alcohol.  Limit alcohol intake to no more than 1 drink per day for nonpregnant women. One drink equals 12 ounces of beer, 5 ounces of wine, or 1 ounces of hard liquor.  Do not use street  drugs.  Do not share needles.  Ask your health care provider for help if you need support or information about quitting drugs.  Tell your health care provider if you often feel depressed.  Tell your health care provider if you have ever been abused or do not feel safe at home. This information is not intended to replace advice given to you by your health care provider. Make sure you discuss any questions you have with your health care provider. Document Released: 04/12/2011 Document Revised: 03/04/2016 Document Reviewed: 07/01/2015 Elsevier Interactive Patient Education  [employer].

## 2017-08-03 ENCOUNTER — Other Ambulatory Visit: Payer: Self-pay | Admitting: Internal Medicine

## 2017-08-03 MED ORDER — NITROFURANTOIN MONOHYD MACRO 100 MG PO CAPS: 100.0000 mg | ORAL_CAPSULE | Freq: Two times a day (BID) | ORAL | 0 refills | Status: DC

## 2017-08-09 ENCOUNTER — Telehealth: Payer: Self-pay

## 2017-08-09 NOTE — Telephone Encounter (Signed)
Patient is scheduled for the 5th

## 2017-08-09 NOTE — Telephone Encounter (Signed)
Both vaccines have been labeled and placed in refrig 

## 2017-08-09 NOTE — Telephone Encounter (Signed)
Left message asking patient to call back to schedule nurse visit to get first shingrix injection--let tamara know when appt is made so that both vaccines can be labeled and placed in refrig 

## 2017-08-15 ENCOUNTER — Ambulatory Visit (INDEPENDENT_AMBULATORY_CARE_PROVIDER_SITE_OTHER): Payer: 59

## 2017-08-15 DIAGNOSIS — Z299 Encounter for prophylactic measures, unspecified: Secondary | ICD-10-CM | POA: Diagnosis not present

## 2017-11-14 ENCOUNTER — Ambulatory Visit (INDEPENDENT_AMBULATORY_CARE_PROVIDER_SITE_OTHER): Payer: 59

## 2017-11-14 DIAGNOSIS — Z299 Encounter for prophylactic measures, unspecified: Secondary | ICD-10-CM | POA: Diagnosis not present

## 2017-11-30 ENCOUNTER — Ambulatory Visit (HOSPITAL_COMMUNITY)
Admission: EM | Admit: 2017-11-30 | Discharge: 2017-11-30 | Disposition: A | Discharge status: Home / Self Care | Payer: 59 | Attending: Family Medicine | Admitting: Family Medicine

## 2017-11-30 ENCOUNTER — Other Ambulatory Visit: Payer: Self-pay

## 2017-11-30 ENCOUNTER — Encounter (HOSPITAL_COMMUNITY): Payer: Self-pay | Admitting: Emergency Medicine

## 2017-11-30 DIAGNOSIS — M436 Torticollis: Secondary | ICD-10-CM | POA: Diagnosis not present

## 2017-11-30 MED ORDER — DIPHENHYDRAMINE HCL 50 MG/ML IJ SOLN
25.0000 mg | Freq: Once | INTRAMUSCULAR | Status: AC
  Administered 2017-11-30: 25 mg via INTRAMUSCULAR

## 2017-11-30 MED ORDER — CYCLOBENZAPRINE HCL 10 MG PO TABS: 10.0000 mg | ORAL_TABLET | Freq: Three times a day (TID) | ORAL | 0 refills | Status: DC

## 2017-11-30 MED ORDER — DIPHENHYDRAMINE HCL 50 MG/ML IJ SOLN
INTRAMUSCULAR | Status: AC
  Filled 2017-11-30: qty 1

## 2017-11-30 NOTE — ED Triage Notes (Signed)
Neck stiffness and pain for a week.  Pain is getting worse.  No known injury

## 2017-12-01 NOTE — ED Provider Notes (Signed)
  [facility]   [id number] 11/30/17 Arrival Time: 1644  ASSESSMENT & PLAN:  1. Torticollis, acute     Meds ordered this encounter  Medications  . cyclobenzaprine (FLEXERIL) 10 MG tablet    Sig: Take 1 tablet (10 mg total) by mouth 3 (three) times daily.    Dispense:  20 tablet    Refill:  0  . diphenhydrAMINE (BENADRYL) injection 25 mg   Will fill Rx for Flexeril tomorrow if not seeing significant improvement overnight. May f/u with PCP or here should she have need.  Reviewed expectations re: course of current medical issues. Questions answered. Outlined signs and symptoms indicating need for more acute intervention. Patient verbalized understanding. After Visit Summary given.  SUBJECTIVE: History from: patient. Teresa West is a 57 y.o. female who reports localized mild pain of her bilateral neck that is stable; fairly persistent; described as stiffness without radiation. Onset: gradual, a week ago. Injury/trama: no. Relieved by: holding neck still. Worsened by: certain movements; turning head to the left produces the most discomfort Associated symptoms: none reported. Extremity sensation changes or weakness: none. Self treatment: has not tried OTCs for relief of pain. History of similar: no  ROS: As per HPI.   OBJECTIVE:  Vitals:   11/30/17 1750  BP: 131/64  Pulse: 64  Resp: 18  Temp: 98.6 F (37 C)  TempSrc: Oral  SpO2: 100%    General appearance: alert; no distress Extremities: no cyanosis or edema; symmetrical with no gross deformities; localized tenderness over her left neck musculature with no swelling and no bruising; ROM: limited by pain; no midline tenderness CV: normal extremity capillary refill Skin: warm and dry Neurologic: normal gait; normal symmetric reflexes in all extremities; normal sensation in all extremities Psychological: alert and cooperative; normal mood and affect  Allergies  Allergen Reactions  . Naproxen Nausea Only     Past Medical History:  Diagnosis Date  . Allergy   . Anemia   . Blood transfusion without reported diagnosis 1984   after childbirth  . Mitral valve prolapse    Social History   Socioeconomic History  . Marital status: Married    Spouse name: Not on file  . Number of children: Not on file  . Years of education: Not on file  . Highest education level: Not on file  Social Needs  . Financial resource strain: Not on file  . Food insecurity - worry: Not on file  . Food insecurity - inability: Not on file  . Transportation needs - medical: Not on file  . Transportation needs - non-medical: Not on file  Occupational History  . Not on file  Tobacco Use  . Smoking status: Never Smoker  . Smokeless tobacco: Never Used  Substance and Sexual Activity  . Alcohol use: No  . Drug use: No  . Sexual activity: Not on file  Other Topics Concern  . Not on file  Social History Narrative  . Not on file   Family History  Problem Relation Age of Onset  . Diabetes Mother   . Hypertension Mother    Past Surgical History:  Procedure Laterality Date  . APPENDECTOMY    . COLONOSCOPY WITH PROPOFOL N/A 06/07/2016   Procedure: COLONOSCOPY WITH PROPOFOL;  Surgeon: Charolett BumpersMartin K Johnson, MD;  Location: [facility];  Service: Endoscopy;  Laterality: N/A;  . ROTATOR CUFF REPAIR Left   . TUBAL LIGATION  1984      Mardella LaymanHagler, Marcena Dias, MD 12/01/17 [phone]

## 2018-03-22 ENCOUNTER — Encounter: Payer: Self-pay | Admitting: Family

## 2018-03-22 ENCOUNTER — Ambulatory Visit (INDEPENDENT_AMBULATORY_CARE_PROVIDER_SITE_OTHER): Payer: 59 | Admitting: Family

## 2018-03-22 ENCOUNTER — Other Ambulatory Visit: Payer: Self-pay | Admitting: Family

## 2018-03-22 ENCOUNTER — Ambulatory Visit: Payer: Self-pay | Admitting: *Deleted

## 2018-03-22 VITALS — BP 124/82 | HR 72 | Temp 98.8°F | Ht 66.0 in | Wt 191.0 lb

## 2018-03-22 DIAGNOSIS — R42 Dizziness and giddiness: Secondary | ICD-10-CM | POA: Diagnosis not present

## 2018-03-22 DIAGNOSIS — H6123 Impacted cerumen, bilateral: Secondary | ICD-10-CM

## 2018-03-22 MED ORDER — FLUTICASONE PROPIONATE 50 MCG/ACT NA SUSP: 2.0000 | Freq: Every day | NASAL | 6 refills | Status: DC

## 2018-03-22 MED ORDER — CEFDINIR 300 MG PO CAPS: 300.0000 mg | ORAL_CAPSULE | Freq: Two times a day (BID) | ORAL | 0 refills | Status: DC

## 2018-03-22 NOTE — Patient Instructions (Signed)
Start OTC Debrox for both ears; follow-up on Friday for lavage;

## 2018-03-22 NOTE — Telephone Encounter (Signed)
Pt reports intermittent dizziness x 1 month, episodes occurring more frequently past week.  States room spinning at times "A little,like I'm in a tunnel." Positional, worse sitting to standing and bending over. Episodes are brief and resolve with sitting, rest.  Reports "tension headache across forehead", 2-3/10. Also reports right ear "Feels clogged", dry cough, intermittent nausea and poor concentration. Denies any SOB, CP.Appt made for today with L. Dayton ScrapeMurray. Care advise given per protocol. Directed to ED if symptoms worsen. Reason for Disposition . [1] MODERATE dizziness (e.g., vertigo; feels very unsteady, interferes with normal activities) AND [2] has NOT been evaluated by physician for this  Answer Assessment - Initial Assessment Questions 1. DESCRIPTION: "Describe your dizziness."     Spins at times, mostly lightheaded, intermittent x 1 month 2. VERTIGO: "Do you feel like either you or the room is spinning or tilting?"      A little 3. LIGHTHEADED: "Do you feel lightheaded?" (e.g., somewhat faint, woozy, weak upon standing)     Yes, not presently 4. SEVERITY: "How bad is it?"  "Can you walk?"   - MILD - Feels unsteady but walking normally.   - MODERATE - Feels very unsteady when walking, but not falling; interferes with normal activities (e.g., school, work) .   - SEVERE - Unable to walk without falling (requires assistance).     Has to sit back down 5. ONSET:  "When did the dizziness begin?"     1 month ago, episodes more frequent 6. AGGRAVATING FACTORS: "Does anything make it worse?" (e.g., standing, change in head position)     Standing, bending over 7. CAUSE: "What do you think is causing the dizziness?"     Unsure 8. RECURRENT SYMPTOM: "Have you had dizziness before?" If so, ask: "When was the last time?" "What happened that time?"     No 9. OTHER SYMPTOMS: "Do you have any other symptoms?" (e.g., headache, weakness, numbness, vomiting, earache)     Nausea, intermittent, "Tension  headache" 2-3/10, right ear clogged, dry cough, hard to concentrate  Protocols used: DIZZINESS - VERTIGO-A-AH

## 2018-03-22 NOTE — Progress Notes (Signed)
Teresa West is a 57 y.o. female with the following history as recorded in [employer]:  Patient Active Problem List   Diagnosis Date Noted  . Obesity 08/22/2014  . Routine general medical examination at a health care facility 08/22/2014  . Anemia 12/08/2006  . Mitral valve prolapse 12/08/2006    Current Outpatient Medications  Medication Sig Dispense Refill  . cefdinir (OMNICEF) 300 MG capsule Take 1 capsule (300 mg total) by mouth 2 (two) times daily. 20 capsule 0  . fluticasone (FLONASE) 50 MCG/ACT nasal spray Place 2 sprays into both nostrils daily. 16 g 6   No current facility-administered medications for this visit.     Allergies: Naproxen  Past Medical History:  Diagnosis Date  . Allergy   . Anemia   . Blood transfusion without reported diagnosis 1984   after childbirth  . Mitral valve prolapse     Past Surgical History:  Procedure Laterality Date  . APPENDECTOMY    . COLONOSCOPY WITH PROPOFOL N/A 06/07/2016   Procedure: COLONOSCOPY WITH PROPOFOL;  Surgeon: Charolett Bumpers, MD;  Location: [facility];  Service: Endoscopy;  Laterality: N/A;  . ROTATOR CUFF REPAIR Left   . TUBAL LIGATION  1984    Family History  Problem Relation Age of Onset  . Diabetes Mother   . Hypertension Mother     Social History   Tobacco Use  . Smoking status: Never Smoker  . Smokeless tobacco: Never Used  Substance Use Topics  . Alcohol use: No    Subjective:  Patient presents with concerns for intermittent episodes of recurrent dizziness; having sensation of "room spinning" around her- had to hold onto the wall; has been having increased nausea; has had some blurred vision when the dizziness occurs; right ear has felt clogged up; has been having more cough/ congestion recently- does have some seasonal allergies; Denies any chest pain, shortness of breath; no palpitations; has had problems with ear wax in the past- has required ear lavage in the past;     Objective:  Vitals:   03/22/18 1538  BP: 124/82  Pulse: 72  Temp: 98.8 F (37.1 C)  TempSrc: Oral  SpO2: 95%  Weight: 191 lb 0.6 oz (86.7 kg)  Height: 5\' 6"  (1.676 m)    General: Well developed, well nourished, in no acute distress  Skin : Warm and dry.  Head: Normocephalic and atraumatic  Eyes: Sclera and conjunctiva clear; pupils round and reactive to light; extraocular movements intact  Ears: External normal; bilateral cerumen impaction; unable to visualize TMs; Oropharynx: Pink, supple. No suspicious lesions  Neck: Supple without thyromegaly, adenopathy  Lungs: Respirations unlabored; clear to auscultation bilaterally without wheeze, rales, rhonchi  CVS exam: normal rate and regular rhythm.  Neurologic: Alert and oriented; speech intact; face symmetrical; moves all extremities well; CNII-XII intact without focal deficit   Assessment:  1. Vertigo   2. Bilateral impacted cerumen     Plan:  Suspect dizziness due to combination of cerumen impaction and probable sinus infection; start Flonase and Omnicef; use Debrox for the next few days and then return for ear lavage; if symptoms persist, will then update imaging and labs; follow-up to be determined.   No follow-ups on file.  No orders of the defined types were placed in this encounter.   Requested Prescriptions   Signed Prescriptions Disp Refills  . fluticasone (FLONASE) 50 MCG/ACT nasal spray 16 g 6    Sig: Place 2 sprays into both nostrils daily.  . cefdinir (OMNICEF)  300 MG capsule 20 capsule 0    Sig: Take 1 capsule (300 mg total) by mouth 2 (two) times daily.

## 2018-03-23 NOTE — Progress Notes (Signed)
Teresa West - 57 y.o. female MRN [id number]  Date of birth: 1961-04-09  SUBJECTIVE:  Including CC & ROS.  Chief Complaint  Patient presents with  . Follow-up    Teresa West is a 57 y.o. female that is presenting with ear pain. She is currently taking Cefdinir for sinus infection. Sinus pressure improving. She reports her ears still feel clogged. She has been using Debrox once a day.    Review of Systems  Constitutional: Negative for fever.  HENT: Positive for congestion.   Respiratory: Negative for cough.   Cardiovascular: Negative for chest pain.    HISTORY: Past Medical, Surgical, Social, and Family History Reviewed & Updated per EMR.   Pertinent Historical Findings include:  Past Medical History:  Diagnosis Date  . Allergy   . Anemia   . Blood transfusion without reported diagnosis 1984   after childbirth  . Mitral valve prolapse     Past Surgical History:  Procedure Laterality Date  . APPENDECTOMY    . COLONOSCOPY WITH PROPOFOL N/A 06/07/2016   Procedure: COLONOSCOPY WITH PROPOFOL;  Surgeon: Charolett BumpersMartin K Johnson, MD;  Location: [facility];  Service: Endoscopy;  Laterality: N/A;  . ROTATOR CUFF REPAIR Left   . TUBAL LIGATION  1984    Allergies  Allergen Reactions  . Naproxen Nausea Only    Family History  Problem Relation Age of Onset  . Diabetes Mother   . Hypertension Mother      Social History   Socioeconomic History  . Marital status: Married    Spouse name: Not on file  . Number of children: Not on file  . Years of education: Not on file  . Highest education level: Not on file  Occupational History  . Not on file  Social Needs  . Financial resource strain: Not on file  . Food insecurity:    Worry: Not on file    Inability: Not on file  . Transportation needs:    Medical: Not on file    Non-medical: Not on file  Tobacco Use  . Smoking status: Never Smoker  . Smokeless tobacco: Never Used  Substance and Sexual Activity  . Alcohol  use: No  . Drug use: No  . Sexual activity: Not on file  Lifestyle  . Physical activity:    Days per week: Not on file    Minutes per session: Not on file  . Stress: Not on file  Relationships  . Social connections:    Talks on phone: Not on file    Gets together: Not on file    Attends religious service: Not on file    Active member of club or organization: Not on file    Attends meetings of clubs or organizations: Not on file    Relationship status: Not on file  . Intimate partner violence:    Fear of current or ex partner: Not on file    Emotionally abused: Not on file    Physically abused: Not on file    Forced sexual activity: Not on file  Other Topics Concern  . Not on file  Social History Narrative  . Not on file     PHYSICAL EXAM:  VS: BP 126/78 (BP Location: Left Arm, Patient Position: Sitting, Cuff Size: Normal)   Pulse 72   Temp 98.2 F (36.8 C) (Oral)   Ht 5\' 6"  (1.676 m)   Wt 191 lb (86.6 kg)   LMP 01/05/2011   SpO2 96%   BMI 30.83  kg/m  Physical Exam Gen: NAD, alert, cooperative with exam, well-appearing ENT: normal lips, normal nasal mucosa, cerumen impaction b/l  Eye: normal EOM, normal conjunctiva and lids CV:  no edema, +2 pedal pulses   Resp: no accessory muscle use, non-labored,  Skin: no rashes, no areas of induration  Neuro: normal tone, normal sensation to touch Psych:  normal insight, alert and oriented MSK: normal gait, normal strength      ASSESSMENT & PLAN:   Bilateral impacted cerumen Irrigation was attempted today. Limited improvement on the right. Advised to continue the debrox and can follow up if she feels like her ears are still clogged.

## 2018-03-24 ENCOUNTER — Ambulatory Visit: Payer: 59 | Admitting: Internal Medicine

## 2018-03-24 ENCOUNTER — Ambulatory Visit (INDEPENDENT_AMBULATORY_CARE_PROVIDER_SITE_OTHER): Payer: 59 | Admitting: Family Medicine

## 2018-03-24 ENCOUNTER — Encounter: Payer: Self-pay | Admitting: Family Medicine

## 2018-03-24 VITALS — BP 126/78 | HR 72 | Temp 98.2°F | Ht 66.0 in | Wt 191.0 lb

## 2018-03-24 DIAGNOSIS — H6123 Impacted cerumen, bilateral: Secondary | ICD-10-CM | POA: Diagnosis not present

## 2018-03-24 NOTE — Assessment & Plan Note (Signed)
Irrigation was attempted today. Limited improvement on the right. Advised to continue the debrox and can follow up if she feels like her ears are still clogged.

## 2018-03-24 NOTE — Patient Instructions (Signed)
Please continue to try the debrox  Please finish the antibiotics Please follow up if

## 2018-04-18 ENCOUNTER — Other Ambulatory Visit: Payer: Self-pay | Admitting: Internal Medicine

## 2018-04-18 DIAGNOSIS — Z1231 Encounter for screening mammogram for malignant neoplasm of breast: Secondary | ICD-10-CM

## 2018-04-20 ENCOUNTER — Encounter: Payer: Self-pay | Admitting: Internal Medicine

## 2018-04-20 ENCOUNTER — Ambulatory Visit (INDEPENDENT_AMBULATORY_CARE_PROVIDER_SITE_OTHER): Payer: 59 | Admitting: Internal Medicine

## 2018-04-20 DIAGNOSIS — M542 Cervicalgia: Secondary | ICD-10-CM

## 2018-04-20 MED ORDER — CYCLOBENZAPRINE HCL 10 MG PO TABS: 10.0000 mg | ORAL_TABLET | Freq: Three times a day (TID) | ORAL | 0 refills | Status: DC | PRN

## 2018-04-20 MED ORDER — DICLOFENAC SODIUM 1 % TD GEL: 2.0000 g | Freq: Four times a day (QID) | TRANSDERMAL | 3 refills | Status: DC | PRN

## 2018-04-20 NOTE — Progress Notes (Signed)
   Subjective:    Patient ID: Teresa West, female    DOB: 1961/08/08, 57 y.o.   MRN: [id number]  HPI The patient is a 57 YO female coming in for neck and shoulder pain. This started about 1-2 weeks ago. She thinks she slept on it funny and it is sore. Originally her ROM was limited but she is trying to stretch it more. Has tried taking muscle relaxers for it which did not help. She does get flares of this sometimes and had flexeril leftover from the ER. This is not helping this time. She wonders why this has happened 2-3 times this year.   Review of Systems  Constitutional: Positive for activity change. Negative for appetite change, chills, fatigue, fever and unexpected weight change.  Respiratory: Negative.   Cardiovascular: Negative.   Gastrointestinal: Negative.   Musculoskeletal: Positive for arthralgias, back pain, myalgias and neck pain. Negative for gait problem and joint swelling.  Skin: Negative.   Neurological: Negative.       Objective:   Physical Exam  Constitutional: She is oriented to person, place, and time. She appears well-developed and well-nourished.  HENT:  Head: Normocephalic and atraumatic.  Eyes: EOM are normal.  Neck: Normal range of motion.  Cardiovascular: Normal rate and regular rhythm.  Pulmonary/Chest: Effort normal and breath sounds normal. No respiratory distress. She has no wheezes. She has no rales.  Abdominal: Soft.  Musculoskeletal: She exhibits tenderness. She exhibits no edema.  Paraspinal tenderness cervical region, scapular pain  Neurological: She is alert and oriented to person, place, and time. Coordination normal.  Skin: Skin is warm and dry.   Vitals:   04/20/18 1511  BP: 110/80  Pulse: 63  Temp: 98.2 F (36.8 C)  TempSrc: Oral  SpO2: 99%  Weight: 189 lb (85.7 kg)  Height: 5\' 6"  (1.676 m)      Assessment & Plan:

## 2018-04-20 NOTE — Patient Instructions (Addendum)
We have sent in voltaren gel to use topically up to 3 times per day for the pain.   You can also take tylenol or advil during the day for pain.

## 2018-04-21 DIAGNOSIS — M542 Cervicalgia: Secondary | ICD-10-CM | POA: Insufficient documentation

## 2018-04-21 NOTE — Assessment & Plan Note (Addendum)
Suspect muscular and refill of flexeril for night time. Advised to take ibuprofen (she tolerates well) or tylenol during the day. She declined PT today. Rx for voltaren gel as well.

## 2018-05-15 ENCOUNTER — Ambulatory Visit: Payer: 59

## 2018-06-06 ENCOUNTER — Ambulatory Visit
Admission: RE | Admit: 2018-06-06 | Discharge: 2018-06-06 | Disposition: A | Discharge status: Home / Self Care | Payer: 59 | Source: Ambulatory Visit | Attending: Internal Medicine | Admitting: Internal Medicine

## 2018-06-06 DIAGNOSIS — Z1231 Encounter for screening mammogram for malignant neoplasm of breast: Secondary | ICD-10-CM | POA: Diagnosis not present

## 2018-06-29 DIAGNOSIS — Z23 Encounter for immunization: Secondary | ICD-10-CM | POA: Diagnosis not present

## 2019-06-11 ENCOUNTER — Other Ambulatory Visit: Payer: Self-pay | Admitting: Internal Medicine

## 2019-06-11 DIAGNOSIS — Z1231 Encounter for screening mammogram for malignant neoplasm of breast: Secondary | ICD-10-CM

## 2019-06-12 ENCOUNTER — Other Ambulatory Visit: Payer: Self-pay

## 2019-06-12 ENCOUNTER — Ambulatory Visit
Admission: RE | Admit: 2019-06-12 | Discharge: 2019-06-12 | Disposition: A | Discharge status: Home / Self Care | Payer: 59 | Source: Ambulatory Visit | Attending: Internal Medicine | Admitting: Internal Medicine

## 2019-06-12 DIAGNOSIS — Z1231 Encounter for screening mammogram for malignant neoplasm of breast: Secondary | ICD-10-CM

## 2019-06-14 ENCOUNTER — Other Ambulatory Visit: Payer: Self-pay | Admitting: Internal Medicine

## 2019-06-14 DIAGNOSIS — R928 Other abnormal and inconclusive findings on diagnostic imaging of breast: Secondary | ICD-10-CM

## 2019-06-20 ENCOUNTER — Other Ambulatory Visit: Payer: Self-pay

## 2019-06-20 ENCOUNTER — Ambulatory Visit
Admission: RE | Admit: 2019-06-20 | Discharge: 2019-06-20 | Disposition: A | Discharge status: Home / Self Care | Payer: 59 | Source: Ambulatory Visit | Attending: Internal Medicine | Admitting: Internal Medicine

## 2019-06-20 DIAGNOSIS — N6489 Other specified disorders of breast: Secondary | ICD-10-CM | POA: Diagnosis not present

## 2019-06-20 DIAGNOSIS — R928 Other abnormal and inconclusive findings on diagnostic imaging of breast: Secondary | ICD-10-CM | POA: Diagnosis not present

## 2019-07-20 DIAGNOSIS — Z23 Encounter for immunization: Secondary | ICD-10-CM | POA: Diagnosis not present

## 2019-10-22 ENCOUNTER — Ambulatory Visit: Payer: Self-pay | Admitting: *Deleted

## 2019-10-22 NOTE — Telephone Encounter (Signed)
  Patient is calling with dizziness that she feels is related to inner ear problem. She states she had this last year and this is very similar. Call to office for appointment. Reason for Disposition . [1] MILD dizziness (e.g., walking normally) AND [2] has NOT been evaluated by physician for this  (Exception: dizziness caused by heat exposure, sudden standing, or poor fluid intake)  Answer Assessment - Initial Assessment Questions 1. DESCRIPTION: "Describe your dizziness."     When stands the room spins 2. LIGHTHEADED: "Do you feel lightheaded?" (e.g., somewhat faint, woozy, weak upon standing)     No 3. VERTIGO: "Do you feel like either you or the room is spinning or tilting?" (i.e. vertigo)     yes 4. SEVERITY: "How bad is it?"  "Do you feel like you are going to faint?" "Can you stand and walk?"   - MILD - walking normally   - MODERATE - interferes with normal activities (e.g., work, school)    - SEVERE - unable to stand, requires support to walk, feels like passing out now.      mild 5. ONSET:  "When did the dizziness begin?"     Since Saturday 6. AGGRAVATING FACTORS: "Does anything make it worse?" (e.g., standing, change in head position)     Changing position 7. HEART RATE: "Can you tell me your heart rate?" "How many beats in 15 seconds?"  (Note: not all patients can do this)       No racing heart rate 8. CAUSE: "What do you think is causing the dizziness?"     Patient feels she is having inner ear problem again 9. RECURRENT SYMPTOM: "Have you had dizziness before?" If so, ask: "When was the last time?" "What happened that time?"     Over 1 year ago- sinus cause- inner ear, treated with medication and it got better 10. OTHER SYMPTOMS: "Do you have any other symptoms?" (e.g., fever, chest pain, vomiting, diarrhea, bleeding)       Headache, R ear clogged, sinus drainage, nausea 11. PREGNANCY: "Is there any chance you are pregnant?" "When was your last menstrual period?"        n/a  Protocols used: DIZZINESS [facility]

## 2019-10-23 ENCOUNTER — Encounter: Payer: Self-pay | Admitting: Internal Medicine

## 2019-10-23 ENCOUNTER — Ambulatory Visit (INDEPENDENT_AMBULATORY_CARE_PROVIDER_SITE_OTHER): Payer: 59 | Admitting: Internal Medicine

## 2019-10-23 DIAGNOSIS — Z20822 Contact with and (suspected) exposure to covid-19: Secondary | ICD-10-CM | POA: Diagnosis not present

## 2019-10-23 MED ORDER — FLUTICASONE PROPIONATE 50 MCG/ACT NA SUSP: 2.0000 | Freq: Every day | NASAL | 6 refills | Status: DC

## 2019-10-23 NOTE — Progress Notes (Signed)
Virtual Visit via Video Note  I connected with Teresa West on 10/23/19 at  9:40 AM EST by a video enabled telemedicine application and verified that I am speaking with the correct person using two identifiers.  The patient and the provider were at separate locations throughout the entire encounter.   I discussed the limitations of evaluation and management by telemedicine and the availability of in person appointments. The patient expressed understanding and agreed to proceed. The patient and the provider were the only parties present for the visit unless noted in HPI below.  History of Present Illness: The patient is a 59 y.o. female with visit for nausea, headaches, dizziness. Started Saturday. Has some nasal congestion as well as the sinus type headache. This is mild overall. Denies SOB or cough. Overall it is not improving. Dizziness is gone as she drank a lot of extra fluids yesterday and this helped. Denies syncope. Has tried flonase.  Observations/Objective: Appearance: normal, breathing appears normal, no coughing or dyspnea during visit, casual grooming, abdomen does not appear distended, throat not visualized, memory normal, mental status is A and O times 3  Assessment and Plan: See problem oriented charting  Follow Up Instructions: rx flonase, covid-19 testing  I discussed the assessment and treatment plan with the patient. The patient was provided an opportunity to ask questions and all were answered. The patient agreed with the plan and demonstrated an understanding of the instructions.   The patient was advised to call back or seek an in-person evaluation if the symptoms worsen or if the condition fails to improve as anticipated.  Teresa Broker, MD

## 2019-10-23 NOTE — Assessment & Plan Note (Signed)
Rx flonase, covid-19 testing.

## 2019-10-24 ENCOUNTER — Ambulatory Visit: Payer: 59 | Attending: Internal Medicine

## 2019-10-24 DIAGNOSIS — Z20822 Contact with and (suspected) exposure to covid-19: Secondary | ICD-10-CM

## 2019-10-26 LAB — NOVEL CORONAVIRUS, NAA: SARS-CoV-2, NAA: NOT DETECTED

## 2020-02-19 DIAGNOSIS — H538 Other visual disturbances: Secondary | ICD-10-CM | POA: Diagnosis not present

## 2020-02-19 DIAGNOSIS — H52223 Regular astigmatism, bilateral: Secondary | ICD-10-CM | POA: Diagnosis not present

## 2020-02-19 DIAGNOSIS — H1045 Other chronic allergic conjunctivitis: Secondary | ICD-10-CM | POA: Diagnosis not present

## 2020-02-19 DIAGNOSIS — H5213 Myopia, bilateral: Secondary | ICD-10-CM | POA: Diagnosis not present

## 2020-02-19 DIAGNOSIS — H47323 Drusen of optic disc, bilateral: Secondary | ICD-10-CM | POA: Diagnosis not present

## 2020-02-19 DIAGNOSIS — H40013 Open angle with borderline findings, low risk, bilateral: Secondary | ICD-10-CM | POA: Diagnosis not present

## 2020-02-19 DIAGNOSIS — H2513 Age-related nuclear cataract, bilateral: Secondary | ICD-10-CM | POA: Diagnosis not present

## 2020-02-19 DIAGNOSIS — H524 Presbyopia: Secondary | ICD-10-CM | POA: Diagnosis not present

## 2020-02-20 ENCOUNTER — Other Ambulatory Visit: Payer: Self-pay

## 2020-02-20 ENCOUNTER — Encounter: Payer: Self-pay | Admitting: Internal Medicine

## 2020-02-20 ENCOUNTER — Ambulatory Visit (INDEPENDENT_AMBULATORY_CARE_PROVIDER_SITE_OTHER): Payer: 59 | Admitting: Internal Medicine

## 2020-02-20 VITALS — BP 118/82 | HR 62 | Temp 98.0°F | Ht 66.0 in | Wt 213.0 lb

## 2020-02-20 DIAGNOSIS — Z0000 Encounter for general adult medical examination without abnormal findings: Secondary | ICD-10-CM | POA: Diagnosis not present

## 2020-02-20 LAB — LIPID PANEL
Cholesterol: 163 mg/dL (ref 0–200)
HDL: 65.4 mg/dL (ref >39.00)
LDL Cholesterol: 86 mg/dL (ref 0–99)
NonHDL: 97.7
Total CHOL/HDL Ratio: 2
Triglycerides: 59 mg/dL (ref 0.0–149.0)
VLDL: 11.8 mg/dL (ref 0.0–40.0)

## 2020-02-20 LAB — COMPREHENSIVE METABOLIC PANEL
ALT: 16 U/L (ref 0–35)
AST: 16 U/L (ref 0–37)
Albumin: 4.3 g/dL (ref 3.5–5.2)
Alkaline Phosphatase: 84 U/L (ref 39–117)
BUN: 13 mg/dL (ref 6–23)
CO2: 31 mEq/L (ref 19–32)
Calcium: 9.6 mg/dL (ref 8.4–10.5)
Chloride: 101 mEq/L (ref 96–112)
Creatinine, Ser: 0.72 mg/dL (ref 0.40–1.20)
GFR: 100.19 mL/min (ref >60.00)
Glucose, Bld: 109 mg/dL — ABNORMAL HIGH (ref 70–99)
Potassium: 4 mEq/L (ref 3.5–5.1)
Sodium: 138 mEq/L (ref 135–145)
Total Bilirubin: 0.4 mg/dL (ref 0.2–1.2)
Total Protein: 7.6 g/dL (ref 6.0–8.3)

## 2020-02-20 LAB — CBC
HCT: 36.8 % (ref 36.0–46.0)
Hemoglobin: 12.3 g/dL (ref 12.0–15.0)
MCHC: 33.5 g/dL (ref 30.0–36.0)
MCV: 87.3 fl (ref 78.0–100.0)
Platelets: 202 10*3/uL (ref 150.0–400.0)
RBC: 4.22 Mil/uL (ref 3.87–5.11)
RDW: 13.2 % (ref 11.5–15.5)
WBC: 4 10*3/uL (ref 4.0–10.5)

## 2020-02-20 LAB — HEMOGLOBIN A1C: Hgb A1c MFr Bld: 6.1 % (ref 4.6–6.5)

## 2020-02-20 NOTE — Assessment & Plan Note (Signed)
Flu shot up to date. Covid-19 up to date. Shingrix complete. Tetanus up to date. Colonoscopy up to date. Mammogram up to date, pap smear counsele. Counseled about sun safety and mole surveillance. Counseled about the dangers of distracted driving. Given 10 year screening recommendations.

## 2020-02-20 NOTE — Progress Notes (Signed)
   Subjective:   Patient ID: Teresa West, female    DOB: 03-10-1961, 59 y.o.   MRN: [id number]  HPI The patient is a 59 YO female coming in for physical.   PMH, FMH, social history reviewed and updated  Review of Systems  Constitutional: Negative.   HENT: Negative.   Eyes: Negative.   Respiratory: Negative for cough, chest tightness and shortness of breath.   Cardiovascular: Negative for chest pain, palpitations and leg swelling.  Gastrointestinal: Negative for abdominal distention, abdominal pain, constipation, diarrhea, nausea and vomiting.  Musculoskeletal: Negative.   Skin: Negative.   Neurological: Negative.   Psychiatric/Behavioral: Negative.     Objective:  Physical Exam Constitutional:      Appearance: She is well-developed.  HENT:     Head: Normocephalic and atraumatic.  Cardiovascular:     Rate and Rhythm: Normal rate and regular rhythm.  Pulmonary:     Effort: Pulmonary effort is normal. No respiratory distress.     Breath sounds: Normal breath sounds. No wheezing or rales.  Abdominal:     General: Bowel sounds are normal. There is no distension.     Palpations: Abdomen is soft.     Tenderness: There is no abdominal tenderness. There is no rebound.  Musculoskeletal:     Cervical back: Normal range of motion.  Skin:    General: Skin is warm and dry.  Neurological:     Mental Status: She is alert and oriented to person, place, and time.     Coordination: Coordination normal.     Vitals:   02/20/20 0952  BP: 118/82  Pulse: 62  Temp: 98 F (36.7 C)  SpO2: 100%  Weight: 213 lb (96.6 kg)  Height: 5\' 6"  (1.676 m)    This visit occurred during the SARS-CoV-2 public health emergency.  Safety protocols were in place, including screening questions prior to the visit, additional usage of staff PPE, and extensive cleaning of exam room while observing appropriate contact time as indicated for disinfecting solutions.   Assessment & Plan:

## 2020-02-20 NOTE — Patient Instructions (Signed)
Health Maintenance, Female Adopting a healthy lifestyle and getting preventive care are important in promoting health and wellness. Ask your health care provider about:  The right schedule for you to have regular tests and exams.  Things you can do on your own to prevent diseases and keep yourself healthy. What should I know about diet, weight, and exercise? Eat a healthy diet   Eat a diet that includes plenty of vegetables, fruits, low-fat dairy products, and lean protein.  Do not eat a lot of foods that are high in solid fats, added sugars, or sodium. Maintain a healthy weight Body mass index (BMI) is used to identify weight problems. It estimates body fat based on height and weight. Your health care provider can help determine your BMI and help you achieve or maintain a healthy weight. Get regular exercise Get regular exercise. This is one of the most important things you can do for your health. Most adults should:  Exercise for at least 150 minutes each week. The exercise should increase your heart rate and make you sweat (moderate-intensity exercise).  Do strengthening exercises at least twice a week. This is in addition to the moderate-intensity exercise.  Spend less time sitting. Even light physical activity can be beneficial. Watch cholesterol and blood lipids Have your blood tested for lipids and cholesterol at 60 years of age, then have this test every 5 years. Have your cholesterol levels checked more often if:  Your lipid or cholesterol levels are high.  You are older than 60 years of age.  You are at high risk for heart disease. What should I know about cancer screening? Depending on your health history and family history, you may need to have cancer screening at various ages. This may include screening for:  Breast cancer.  Cervical cancer.  Colorectal cancer.  Skin cancer.  Lung cancer. What should I know about heart disease, diabetes, and high blood  pressure? Blood pressure and heart disease  High blood pressure causes heart disease and increases the risk of stroke. This is more likely to develop in people who have high blood pressure readings, are of African descent, or are overweight.  Have your blood pressure checked: ? Every 3-5 years if you are 18-39 years of age. ? Every year if you are 40 years old or older. Diabetes Have regular diabetes screenings. This checks your fasting blood sugar level. Have the screening done:  Once every three years after age 40 if you are at a normal weight and have a low risk for diabetes.  More often and at a younger age if you are overweight or have a high risk for diabetes. What should I know about preventing infection? Hepatitis B If you have a higher risk for hepatitis B, you should be screened for this virus. Talk with your health care provider to find out if you are at risk for hepatitis B infection. Hepatitis C Testing is recommended for:  Everyone born from 1945 through 1965.  Anyone with known risk factors for hepatitis C. Sexually transmitted infections (STIs)  Get screened for STIs, including gonorrhea and chlamydia, if: ? You are sexually active and are younger than 60 years of age. ? You are older than 60 years of age and your health care provider tells you that you are at risk for this type of infection. ? Your sexual activity has changed since you were last screened, and you are at increased risk for chlamydia or gonorrhea. Ask your health care provider if   you are at risk.  Ask your health care provider about whether you are at high risk for HIV. Your health care provider may recommend a prescription medicine to help prevent HIV infection. If you choose to take medicine to prevent HIV, you should first get tested for HIV. You should then be tested every 3 months for as long as you are taking the medicine. Pregnancy  If you are about to stop having your period (premenopausal) and  you may become pregnant, seek counseling before you get pregnant.  Take 400 to 800 micrograms (mcg) of folic acid every day if you become pregnant.  Ask for birth control (contraception) if you want to prevent pregnancy. Osteoporosis and menopause Osteoporosis is a disease in which the bones lose minerals and strength with aging. This can result in bone fractures. If you are 65 years old or older, or if you are at risk for osteoporosis and fractures, ask your health care provider if you should:  Be screened for bone loss.  Take a calcium or vitamin D supplement to lower your risk of fractures.  Be given hormone replacement therapy (HRT) to treat symptoms of menopause. Follow these instructions at home: Lifestyle  Do not use any products that contain nicotine or tobacco, such as cigarettes, e-cigarettes, and chewing tobacco. If you need help quitting, ask your health care provider.  Do not use street drugs.  Do not share needles.  Ask your health care provider for help if you need support or information about quitting drugs. Alcohol use  Do not drink alcohol if: ? Your health care provider tells you not to drink. ? You are pregnant, may be pregnant, or are planning to become pregnant.  If you drink alcohol: ? Limit how much you use to 0-1 drink a day. ? Limit intake if you are breastfeeding.  Be aware of how much alcohol is in your drink. In the [place] one drink equals one 12 oz bottle of beer (355 mL), one 5 oz glass of wine (148 mL), or one 1 oz glass of hard liquor (44 mL). General instructions  Schedule regular health, dental, and eye exams.  Stay current with your vaccines.  Tell your health care provider if: ? You often feel depressed. ? You have ever been abused or do not feel safe at home. Summary  Adopting a healthy lifestyle and getting preventive care are important in promoting health and wellness.  Follow your health care provider's instructions about healthy  diet, exercising, and getting tested or screened for diseases.  Follow your health care provider's instructions on monitoring your cholesterol and blood pressure. This information is not intended to replace advice given to you by your health care provider. Make sure you discuss any questions you have with your health care provider. Document Revised: 09/20/2018 Document Reviewed: 09/20/2018 Elsevier Patient Education  2020 [employer]  

## 2020-11-25 ENCOUNTER — Other Ambulatory Visit: Payer: Self-pay | Admitting: Internal Medicine

## 2020-11-25 DIAGNOSIS — Z1231 Encounter for screening mammogram for malignant neoplasm of breast: Secondary | ICD-10-CM

## 2020-11-29 ENCOUNTER — Other Ambulatory Visit: Payer: Self-pay

## 2020-11-29 ENCOUNTER — Ambulatory Visit: Payer: 59

## 2020-11-29 ENCOUNTER — Ambulatory Visit
Admission: RE | Admit: 2020-11-29 | Discharge: 2020-11-29 | Disposition: A | Discharge status: Home / Self Care | Payer: 59 | Source: Ambulatory Visit | Attending: Internal Medicine | Admitting: Internal Medicine

## 2020-11-29 DIAGNOSIS — Z1231 Encounter for screening mammogram for malignant neoplasm of breast: Secondary | ICD-10-CM

## 2020-12-10 IMAGING — MG MM DIGITAL SCREENING BILAT W/ TOMO W/ CAD
6 of 10 series · 6 of 30 positions shown · non-contrast
Comparison: Previous exam(s).

CLINICAL DATA: Screening.

EXAM:
DIGITAL SCREENING BILATERAL MAMMOGRAM WITH TOMO AND CAD

[R CC synth-2D (1 of 2)]
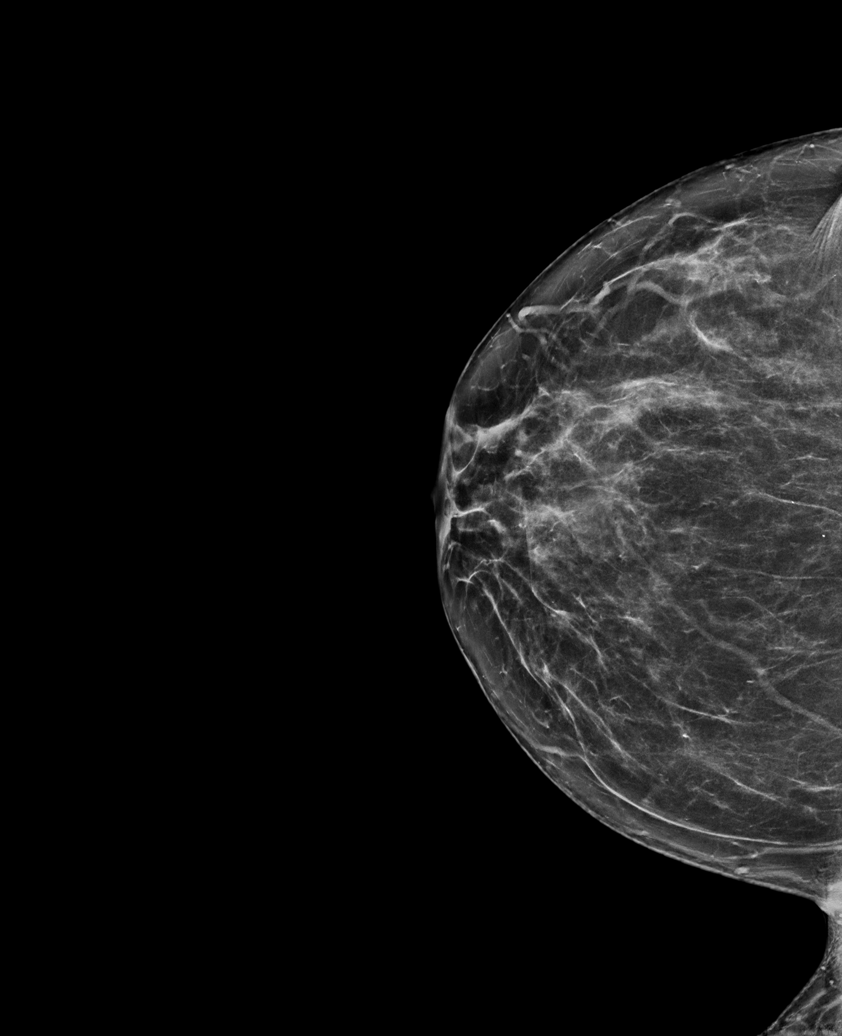

[R CC synth-2D (2 of 2)]
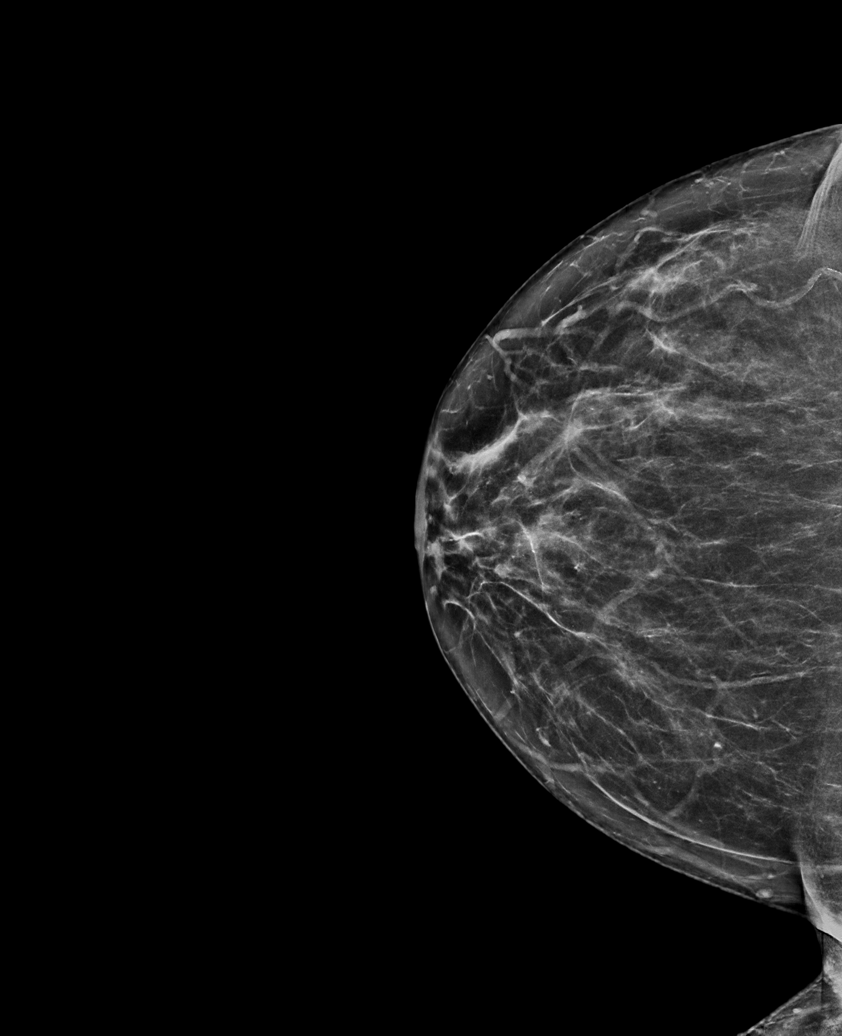

[R MLO synth-2D]
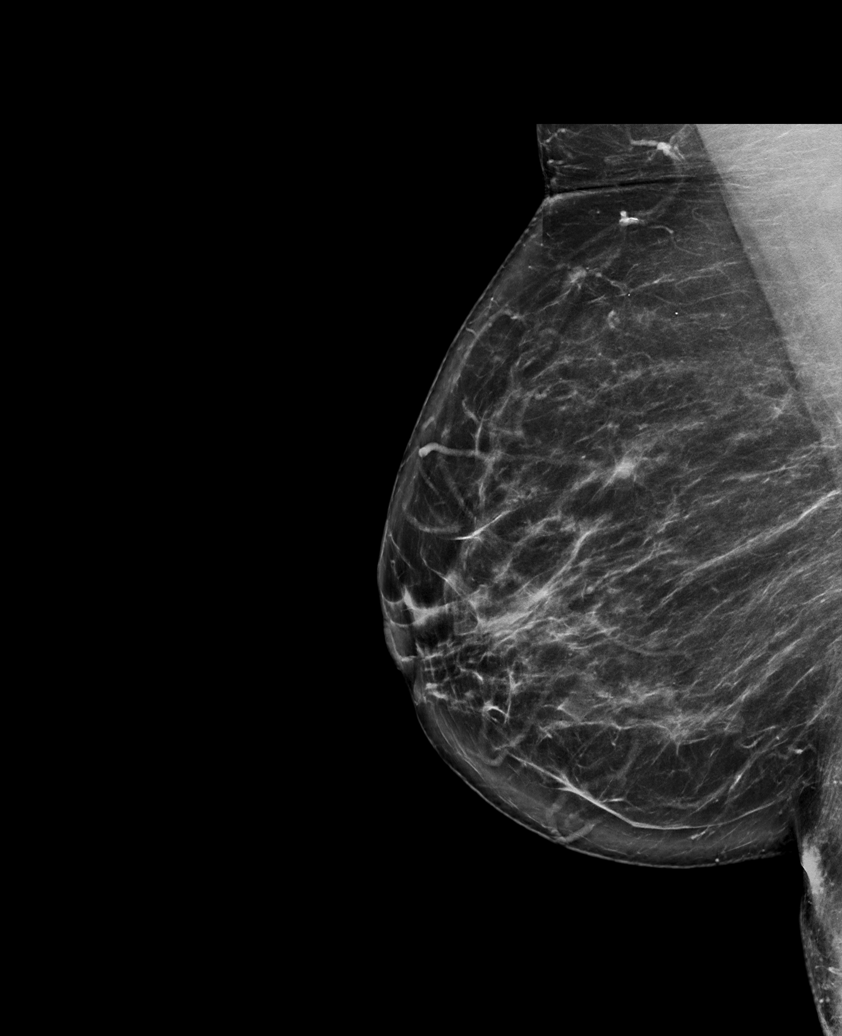

[L CC synth-2D]
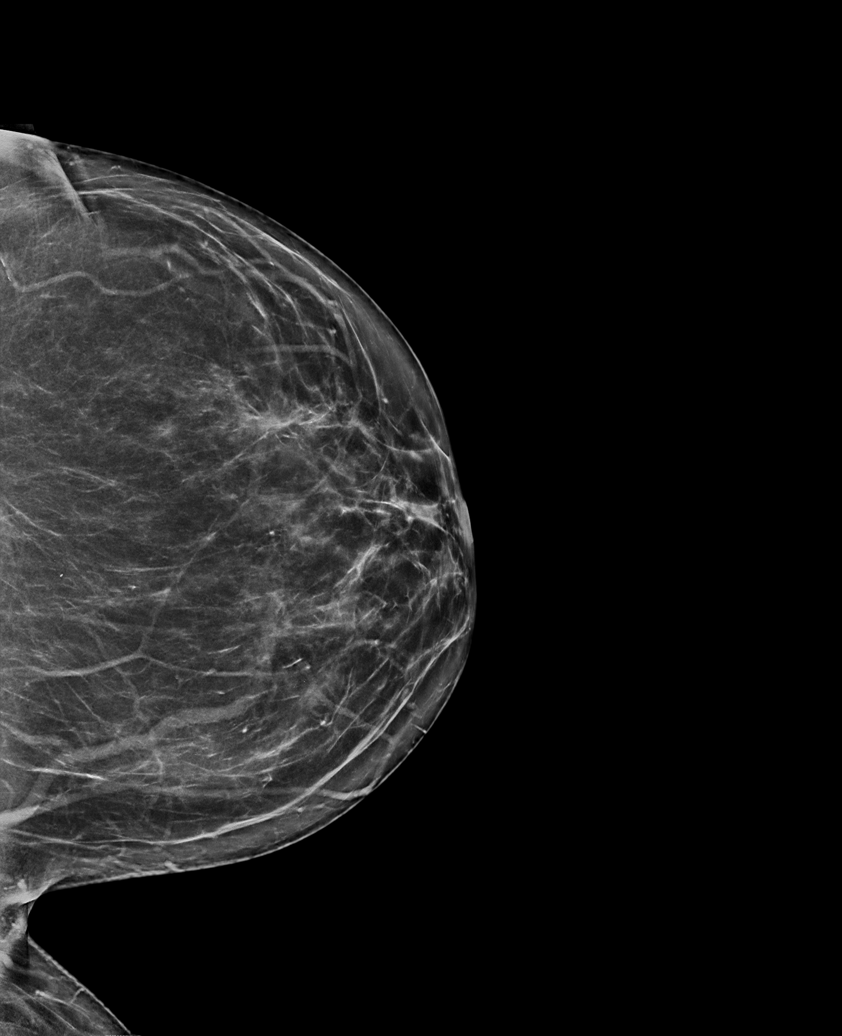

[L MLO synth-2D]
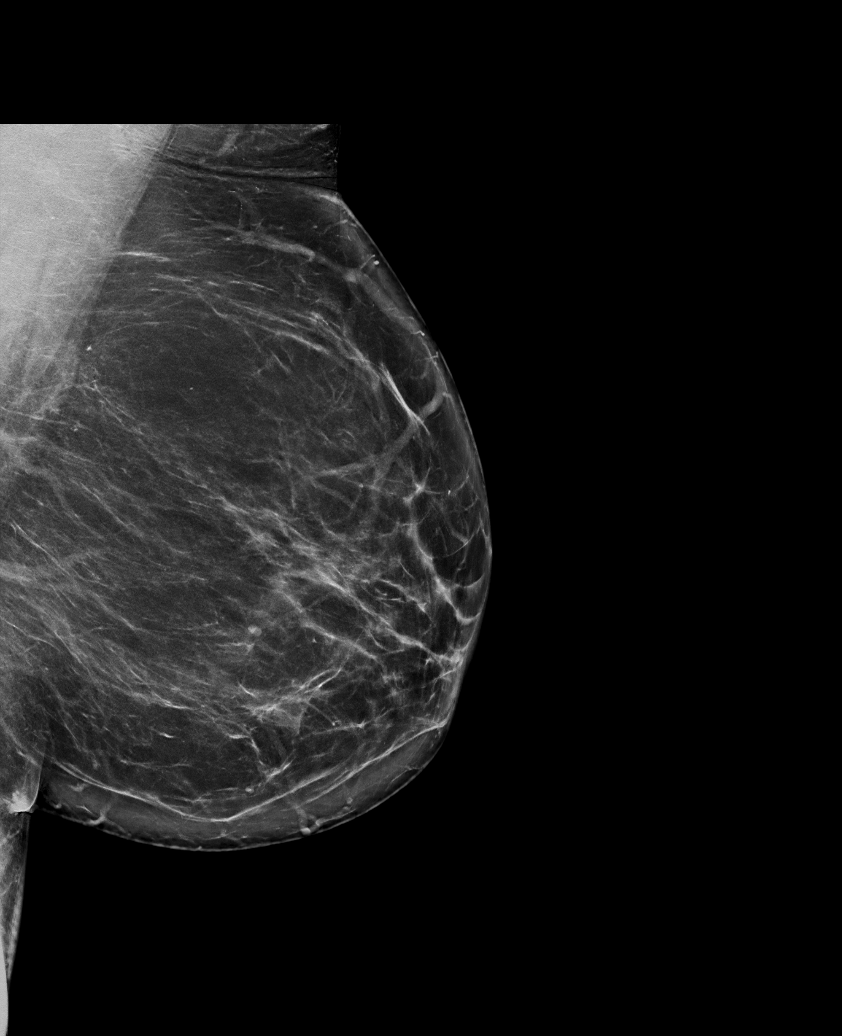

[R MLO tomo · tomo slice 45/88.0]
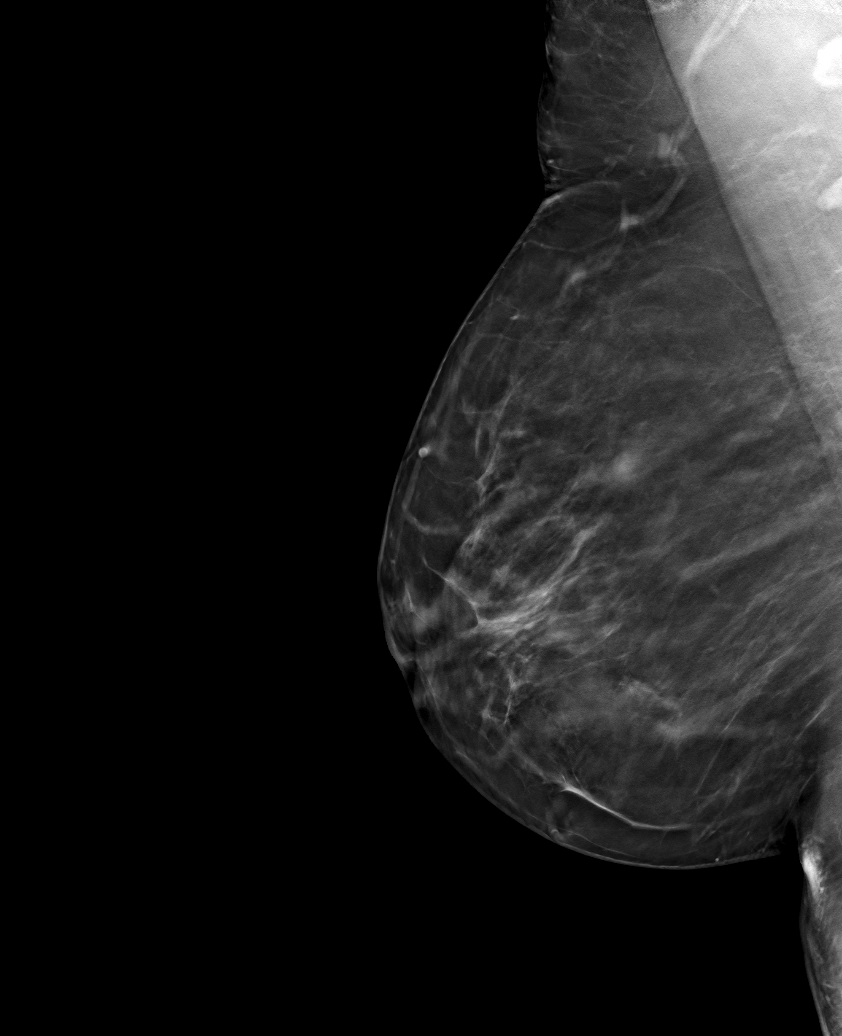

[6 of 30 positions shown; findings below may reference images not displayed]

ACR Breast Density Category b: There are scattered areas of
fibroglandular density.
FINDINGS: In the right breast, a possible asymmetry warrants further
evaluation. In the left breast, no findings suspicious for
malignancy. Images were processed with CAD.
IMPRESSION: Further evaluation is suggested for possible asymmetry in the right
breast.

RECOMMENDATION:
Diagnostic mammogram and possibly ultrasound of the right breast.
(Code:[id number])

The patient will be contacted regarding the findings, and additional
imaging will be scheduled.

BI-RADS CATEGORY  0: Incomplete. Need additional imaging evaluation
and/or prior mammograms for comparison.

## 2021-07-28 ENCOUNTER — Ambulatory Visit (INDEPENDENT_AMBULATORY_CARE_PROVIDER_SITE_OTHER): Payer: Managed Care, Other (non HMO) | Admitting: Internal Medicine

## 2021-07-28 ENCOUNTER — Other Ambulatory Visit: Payer: Self-pay

## 2021-07-28 ENCOUNTER — Encounter: Payer: Self-pay | Admitting: Internal Medicine

## 2021-07-28 VITALS — BP 118/72 | HR 56 | Resp 18 | Ht 66.0 in | Wt 215.2 lb

## 2021-07-28 DIAGNOSIS — E6609 Other obesity due to excess calories: Secondary | ICD-10-CM | POA: Diagnosis not present

## 2021-07-28 DIAGNOSIS — Z23 Encounter for immunization: Secondary | ICD-10-CM

## 2021-07-28 DIAGNOSIS — Z6834 Body mass index (BMI) 34.0-34.9, adult: Secondary | ICD-10-CM

## 2021-07-28 DIAGNOSIS — E66811 Obesity, class 1: Secondary | ICD-10-CM

## 2021-07-28 DIAGNOSIS — Z800 Family history of malignant neoplasm of digestive organs: Secondary | ICD-10-CM

## 2021-07-28 DIAGNOSIS — Z0000 Encounter for general adult medical examination without abnormal findings: Secondary | ICD-10-CM | POA: Diagnosis not present

## 2021-07-28 LAB — LIPID PANEL
Cholesterol: 175 mg/dL (ref 0–200)
HDL: 67.5 mg/dL (ref >39.00)
LDL Cholesterol: 92 mg/dL (ref 0–99)
NonHDL: 107.65
Total CHOL/HDL Ratio: 3
Triglycerides: 76 mg/dL (ref 0.0–149.0)
VLDL: 15.2 mg/dL (ref 0.0–40.0)

## 2021-07-28 LAB — COMPREHENSIVE METABOLIC PANEL
ALT: 17 U/L (ref 0–35)
AST: 18 U/L (ref 0–37)
Albumin: 4.3 g/dL (ref 3.5–5.2)
Alkaline Phosphatase: 80 U/L (ref 39–117)
BUN: 9 mg/dL (ref 6–23)
CO2: 28 mEq/L (ref 19–32)
Calcium: 9.2 mg/dL (ref 8.4–10.5)
Chloride: 104 mEq/L (ref 96–112)
Creatinine, Ser: 0.68 mg/dL (ref 0.40–1.20)
GFR: 94.4 mL/min (ref >60.00)
Glucose, Bld: 108 mg/dL — ABNORMAL HIGH (ref 70–99)
Potassium: 3.9 mEq/L (ref 3.5–5.1)
Sodium: 140 mEq/L (ref 135–145)
Total Bilirubin: 0.5 mg/dL (ref 0.2–1.2)
Total Protein: 7.6 g/dL (ref 6.0–8.3)

## 2021-07-28 LAB — CBC
HCT: 36.7 % (ref 36.0–46.0)
Hemoglobin: 12.1 g/dL (ref 12.0–15.0)
MCHC: 33 g/dL (ref 30.0–36.0)
MCV: 87.1 fl (ref 78.0–100.0)
Platelets: 189 10*3/uL (ref 150.0–400.0)
RBC: 4.21 Mil/uL (ref 3.87–5.11)
RDW: 13 % (ref 11.5–15.5)
WBC: 4.5 10*3/uL (ref 4.0–10.5)

## 2021-07-28 LAB — HEMOGLOBIN A1C: Hgb A1c MFr Bld: 6.3 % (ref 4.6–6.5)

## 2021-07-28 NOTE — Assessment & Plan Note (Signed)
Flu shot given. Covid-19 counseled about booster. Shingrix complete. Tetanus up to date. Colonoscopy referral to GI done. Mammogram up to date, pap smear up to date with gyn. Counseled about sun safety and mole surveillance. Counseled about the dangers of distracted driving. Given 10 year screening recommendations.

## 2021-07-28 NOTE — Patient Instructions (Signed)
We have given you the flu shot today.  

## 2021-07-28 NOTE — Progress Notes (Signed)
   Subjective:   Patient ID: Teresa West, female    DOB: 09-26-1961, 60 y.o.   MRN: [id number]  HPI The patient is a 60 YO female coming in for physical.  PMH, FMH, social history reviewed and updated  Review of Systems  Constitutional: Negative.   HENT: Negative.    Eyes: Negative.   Respiratory:  Negative for cough, chest tightness and shortness of breath.   Cardiovascular:  Negative for chest pain, palpitations and leg swelling.  Gastrointestinal:  Negative for abdominal distention, abdominal pain, constipation, diarrhea, nausea and vomiting.  Musculoskeletal: Negative.   Skin: Negative.   Neurological: Negative.   Psychiatric/Behavioral: Negative.     Objective:  Physical Exam Constitutional:      Appearance: She is well-developed.  HENT:     Head: Normocephalic and atraumatic.  Cardiovascular:     Rate and Rhythm: Normal rate and regular rhythm.  Pulmonary:     Effort: Pulmonary effort is normal. No respiratory distress.     Breath sounds: Normal breath sounds. No wheezing or rales.  Abdominal:     General: Bowel sounds are normal. There is no distension.     Palpations: Abdomen is soft.     Tenderness: There is no abdominal tenderness. There is no rebound.  Musculoskeletal:     Cervical back: Normal range of motion.  Skin:    General: Skin is warm and dry.  Neurological:     Mental Status: She is alert and oriented to person, place, and time.     Coordination: Coordination normal.    Vitals:   07/28/21 1053  BP: 118/72  Pulse: (!) 56  Resp: 18  SpO2: 96%  Weight: 215 lb 3.2 oz (97.6 kg)  Height: 5\' 6"  (1.676 m)    This visit occurred during the SARS-CoV-2 public health emergency.  Safety protocols were in place, including screening questions prior to the visit, additional usage of staff PPE, and extensive cleaning of exam room while observing appropriate contact time as indicated for disinfecting solutions.   Assessment & Plan:  Flu shot given at  visit

## 2021-07-28 NOTE — Assessment & Plan Note (Signed)
Weight is up slightly from last year. She is not exercising currently and counseled about this.

## 2021-08-11 ENCOUNTER — Ambulatory Visit: Payer: Managed Care, Other (non HMO) | Admitting: Family

## 2021-08-11 ENCOUNTER — Telehealth: Payer: Self-pay | Admitting: Internal Medicine

## 2021-08-14 ENCOUNTER — Encounter: Payer: Self-pay | Admitting: Internal Medicine

## 2021-08-14 ENCOUNTER — Ambulatory Visit (INDEPENDENT_AMBULATORY_CARE_PROVIDER_SITE_OTHER): Payer: Managed Care, Other (non HMO) | Admitting: Internal Medicine

## 2021-08-14 ENCOUNTER — Other Ambulatory Visit: Payer: Self-pay

## 2021-08-14 VITALS — BP 122/80 | HR 83 | Temp 98.9°F | Ht 66.0 in | Wt 210.0 lb

## 2021-08-14 DIAGNOSIS — K625 Hemorrhage of anus and rectum: Secondary | ICD-10-CM | POA: Insufficient documentation

## 2021-08-14 LAB — CBC
HCT: 35.1 % — ABNORMAL LOW (ref 36.0–46.0)
Hemoglobin: 11.5 g/dL — ABNORMAL LOW (ref 12.0–15.0)
MCHC: 32.8 g/dL (ref 30.0–36.0)
MCV: 86.5 fl (ref 78.0–100.0)
Platelets: 197 10*3/uL (ref 150.0–400.0)
RBC: 4.06 Mil/uL (ref 3.87–5.11)
RDW: 12.7 % (ref 11.5–15.5)
WBC: 4 10*3/uL (ref 4.0–10.5)

## 2021-08-14 MED ORDER — HYDROCORTISONE 2.5 % EX CREA: TOPICAL_CREAM | Freq: Two times a day (BID) | CUTANEOUS | 0 refills | Status: DC

## 2021-08-14 NOTE — Patient Instructions (Signed)
We have sent in the hemorrhoid cream to use twice a day for 1-2 weeks.  We are checking the labs today.

## 2021-08-14 NOTE — Assessment & Plan Note (Signed)
Suspect internal hemorrhoids. Last colonoscopy without significant diverticular disease. Rx anusol cream and checking CBC to assess volume blood. Referral to GI for colonoscopy already done at physical and recommended to proceed with repeat colonoscopy as she is due.

## 2021-08-14 NOTE — Progress Notes (Signed)
   Subjective:   Patient ID: Teresa West, female    DOB: 07-09-1961, 60 y.o.   MRN: [id number]  HPI The patient is a 60 YO female coming in for rectal bleeding. Family history colon cancer and overdue for repeat colon screening. Last colonoscopy 2017 normal.   Review of Systems  Constitutional: Negative.   HENT: Negative.    Eyes: Negative.   Respiratory:  Negative for cough, chest tightness and shortness of breath.   Cardiovascular:  Negative for chest pain, palpitations and leg swelling.  Gastrointestinal:  Positive for blood in stool. Negative for abdominal distention, abdominal pain, constipation, diarrhea, nausea and vomiting.  Musculoskeletal: Negative.   Skin: Negative.   Neurological: Negative.   Psychiatric/Behavioral: Negative.     Objective:  Physical Exam Constitutional:      Appearance: She is well-developed.  HENT:     Head: Normocephalic and atraumatic.  Cardiovascular:     Rate and Rhythm: Normal rate and regular rhythm.  Pulmonary:     Effort: Pulmonary effort is normal. No respiratory distress.     Breath sounds: Normal breath sounds. No wheezing or rales.  Abdominal:     General: Bowel sounds are normal. There is no distension.     Palpations: Abdomen is soft.     Tenderness: There is no abdominal tenderness. There is no rebound.  Musculoskeletal:     Cervical back: Normal range of motion.  Skin:    General: Skin is warm and dry.  Neurological:     Mental Status: She is alert and oriented to person, place, and time.     Coordination: Coordination normal.    Vitals:   08/14/21 0849  BP: 122/80  Pulse: 83  Temp: 98.9 F (37.2 C)  TempSrc: Oral  SpO2: 97%  Weight: 210 lb (95.3 kg)  Height: 5\' 6"  (1.676 m)    This visit occurred during the SARS-CoV-2 public health emergency.  Safety protocols were in place, including screening questions prior to the visit, additional usage of staff PPE, and extensive cleaning of exam room while observing  appropriate contact time as indicated for disinfecting solutions.   Assessment & Plan:

## 2021-09-02 ENCOUNTER — Encounter: Payer: Self-pay | Admitting: Internal Medicine

## 2021-09-17 ENCOUNTER — Encounter: Payer: Self-pay | Admitting: Internal Medicine

## 2021-11-11 ENCOUNTER — Encounter: Payer: Self-pay | Admitting: Internal Medicine

## 2021-11-11 ENCOUNTER — Other Ambulatory Visit: Payer: Self-pay

## 2021-11-11 ENCOUNTER — Ambulatory Visit (INDEPENDENT_AMBULATORY_CARE_PROVIDER_SITE_OTHER): Payer: Managed Care, Other (non HMO) | Admitting: Internal Medicine

## 2021-11-11 DIAGNOSIS — J0110 Acute frontal sinusitis, unspecified: Secondary | ICD-10-CM | POA: Diagnosis not present

## 2021-11-11 MED ORDER — FLUTICASONE PROPIONATE 50 MCG/ACT NA SUSP: 2.0000 | Freq: Every day | NASAL | 6 refills | Status: DC

## 2021-11-11 MED ORDER — AMOXICILLIN-POT CLAVULANATE 875-125 MG PO TABS: 1.0000 | ORAL_TABLET | Freq: Two times a day (BID) | ORAL | 0 refills | Status: AC

## 2021-11-11 NOTE — Patient Instructions (Signed)
We have sent in flonase to use 2 sprays in each nostril once a day.   We have sent in augmentin to take 1 pill twice a day for 1 week to clear the sinuses.

## 2021-11-11 NOTE — Progress Notes (Signed)
° °  Subjective:   Patient ID: Teresa West, female    DOB: 10-13-1960, 61 y.o.   MRN: [id number]  HPI The patient is a 61 YO female coming in for pain in throat for 2 months and ear pain. Took zyrtec per our recommendations back in November which helped for a little while but this is coming and going. Not taking anything now. Denies fevers and chills. Has taken several covid-19 tests throughout this.  Review of Systems  Constitutional: Negative.  Negative for fatigue, fever and unexpected weight change.  HENT:  Positive for congestion, postnasal drip, rhinorrhea, sinus pressure and sinus pain. Negative for ear discharge, ear pain, sneezing, sore throat, tinnitus, trouble swallowing and voice change.   Eyes: Negative.   Respiratory:  Negative for cough, chest tightness, shortness of breath and wheezing.   Cardiovascular: Negative.  Negative for chest pain, palpitations and leg swelling.  Gastrointestinal: Negative.  Negative for abdominal distention, abdominal pain, constipation, diarrhea, nausea and vomiting.  Musculoskeletal: Negative.   Skin: Negative.   Neurological: Negative.   Psychiatric/Behavioral: Negative.     Objective:  Physical Exam Constitutional:      Appearance: She is well-developed.  HENT:     Head: Normocephalic and atraumatic.     Comments: Oropharynx with redness and clear drainage, nose with swollen turbinates, TMs normal bilaterally.  Neck:     Thyroid: No thyromegaly.  Cardiovascular:     Rate and Rhythm: Normal rate and regular rhythm.  Pulmonary:     Effort: Pulmonary effort is normal. No respiratory distress.     Breath sounds: Normal breath sounds. No wheezing or rales.  Abdominal:     General: Bowel sounds are normal. There is no distension.     Palpations: Abdomen is soft.     Tenderness: There is no abdominal tenderness. There is no rebound.  Musculoskeletal:        General: Tenderness present.     Cervical back: Normal range of motion.   Lymphadenopathy:     Cervical: No cervical adenopathy.  Skin:    General: Skin is warm and dry.  Neurological:     Mental Status: She is alert and oriented to person, place, and time.     Coordination: Coordination normal.    Vitals:   11/11/21 1037  BP: 128/86  Pulse: 68  Temp: 97.9 F (36.6 C)  TempSrc: Oral  SpO2: 99%  Weight: 218 lb 9.6 oz (99.2 kg)  Height: 5\' 6"  (1.676 m)    This visit occurred during the SARS-CoV-2 public health emergency.  Safety protocols were in place, including screening questions prior to the visit, additional usage of staff PPE, and extensive cleaning of exam room while observing appropriate contact time as indicated for disinfecting solutions.   Assessment & Plan:

## 2021-11-12 DIAGNOSIS — J019 Acute sinusitis, unspecified: Secondary | ICD-10-CM | POA: Insufficient documentation

## 2021-11-12 NOTE — Assessment & Plan Note (Signed)
Rx augmentin to treat. Refill flonase and encouraged to use daily as well as to resume zyrtec and take daily.

## 2022-01-26 DIAGNOSIS — H2513 Age-related nuclear cataract, bilateral: Secondary | ICD-10-CM | POA: Diagnosis not present

## 2022-01-26 DIAGNOSIS — H1045 Other chronic allergic conjunctivitis: Secondary | ICD-10-CM | POA: Diagnosis not present

## 2022-01-26 DIAGNOSIS — H47323 Drusen of optic disc, bilateral: Secondary | ICD-10-CM | POA: Diagnosis not present

## 2022-01-26 DIAGNOSIS — H40013 Open angle with borderline findings, low risk, bilateral: Secondary | ICD-10-CM | POA: Diagnosis not present

## 2022-03-09 ENCOUNTER — Other Ambulatory Visit: Payer: Self-pay | Admitting: Internal Medicine

## 2022-03-09 DIAGNOSIS — Z1231 Encounter for screening mammogram for malignant neoplasm of breast: Secondary | ICD-10-CM

## 2022-03-15 ENCOUNTER — Ambulatory Visit
Admission: RE | Admit: 2022-03-15 | Discharge: 2022-03-15 | Disposition: A | Discharge status: Home / Self Care | Payer: BC Managed Care – PPO | Source: Ambulatory Visit | Attending: Internal Medicine | Admitting: Internal Medicine

## 2022-03-15 DIAGNOSIS — Z1231 Encounter for screening mammogram for malignant neoplasm of breast: Secondary | ICD-10-CM | POA: Diagnosis not present

## 2022-07-23 DIAGNOSIS — Z23 Encounter for immunization: Secondary | ICD-10-CM | POA: Diagnosis not present

## 2022-09-13 ENCOUNTER — Encounter: Payer: Self-pay | Admitting: Internal Medicine

## 2022-09-13 ENCOUNTER — Ambulatory Visit (INDEPENDENT_AMBULATORY_CARE_PROVIDER_SITE_OTHER): Payer: BC Managed Care – PPO | Admitting: Internal Medicine

## 2022-09-13 ENCOUNTER — Other Ambulatory Visit: Payer: Self-pay | Admitting: Internal Medicine

## 2022-09-13 VITALS — BP 112/60 | HR 62 | Temp 97.9°F | Wt 220.5 lb

## 2022-09-13 DIAGNOSIS — R7303 Prediabetes: Secondary | ICD-10-CM

## 2022-09-13 DIAGNOSIS — K635 Polyp of colon: Secondary | ICD-10-CM | POA: Diagnosis not present

## 2022-09-13 DIAGNOSIS — E119 Type 2 diabetes mellitus without complications: Secondary | ICD-10-CM

## 2022-09-13 DIAGNOSIS — Z0000 Encounter for general adult medical examination without abnormal findings: Secondary | ICD-10-CM | POA: Diagnosis not present

## 2022-09-13 DIAGNOSIS — E118 Type 2 diabetes mellitus with unspecified complications: Secondary | ICD-10-CM | POA: Insufficient documentation

## 2022-09-13 LAB — COMPREHENSIVE METABOLIC PANEL
ALT: 17 U/L (ref 0–35)
AST: 19 U/L (ref 0–37)
Albumin: 4.1 g/dL (ref 3.5–5.2)
Alkaline Phosphatase: 83 U/L (ref 39–117)
BUN: 12 mg/dL (ref 6–23)
CO2: 29 mEq/L (ref 19–32)
Calcium: 8.9 mg/dL (ref 8.4–10.5)
Chloride: 105 mEq/L (ref 96–112)
Creatinine, Ser: 0.75 mg/dL (ref 0.40–1.20)
GFR: 85.62 mL/min (ref >60.00)
Glucose, Bld: 116 mg/dL — ABNORMAL HIGH (ref 70–99)
Potassium: 4.1 mEq/L (ref 3.5–5.1)
Sodium: 140 mEq/L (ref 135–145)
Total Bilirubin: 0.5 mg/dL (ref 0.2–1.2)
Total Protein: 7.3 g/dL (ref 6.0–8.3)

## 2022-09-13 LAB — LIPID PANEL
Cholesterol: 156 mg/dL (ref 0–200)
HDL: 65.2 mg/dL (ref >39.00)
LDL Cholesterol: 79 mg/dL (ref 0–99)
NonHDL: 90.59
Total CHOL/HDL Ratio: 2
Triglycerides: 59 mg/dL (ref 0.0–149.0)
VLDL: 11.8 mg/dL (ref 0.0–40.0)

## 2022-09-13 LAB — CBC
HCT: 37.1 % (ref 36.0–46.0)
Hemoglobin: 12.3 g/dL (ref 12.0–15.0)
MCHC: 33.1 g/dL (ref 30.0–36.0)
MCV: 86.7 fl (ref 78.0–100.0)
Platelets: 210 10*3/uL (ref 150.0–400.0)
RBC: 4.27 Mil/uL (ref 3.87–5.11)
RDW: 13.2 % (ref 11.5–15.5)
WBC: 4.4 10*3/uL (ref 4.0–10.5)

## 2022-09-13 LAB — HEMOGLOBIN A1C: Hgb A1c MFr Bld: 6.6 % — ABNORMAL HIGH (ref 4.6–6.5)

## 2022-09-13 NOTE — Assessment & Plan Note (Signed)
Flu shot up to date. Covid-19 counseled. Shingrix complete. Tetanus up to date. Colonoscopy overdue referral placed. Mammogram up to date, pap smear overdue. Counseled about sun safety and mole surveillance. Counseled about the dangers of distracted driving. Given 10 year screening recommendations.

## 2022-09-13 NOTE — Assessment & Plan Note (Signed)
Checking HgA1c and lipid panel. Counseled about diet and exercise.  

## 2022-09-13 NOTE — Progress Notes (Signed)
   Subjective:   Patient ID: Teresa West, female    DOB: [birth year], 61 y.o.   MRN: [id number]  HPI The patient is here for physical.  PMH, [facility], social history reviewed and updated  Review of Systems  Constitutional: Negative.   HENT: Negative.    Eyes: Negative.   Respiratory:  Negative for cough, chest tightness and shortness of breath.   Cardiovascular:  Negative for chest pain, palpitations and leg swelling.  Gastrointestinal:  Negative for abdominal distention, abdominal pain, constipation, diarrhea, nausea and vomiting.  Musculoskeletal: Negative.   Skin: Negative.   Neurological: Negative.   Psychiatric/Behavioral: Negative.      Objective:  Physical Exam Constitutional:      Appearance: She is well-developed.  HENT:     Head: Normocephalic and atraumatic.  Cardiovascular:     Rate and Rhythm: Normal rate and regular rhythm.  Pulmonary:     Effort: Pulmonary effort is normal. No respiratory distress.     Breath sounds: Normal breath sounds. No wheezing or rales.  Abdominal:     General: Bowel sounds are normal. There is no distension.     Palpations: Abdomen is soft.     Tenderness: There is no abdominal tenderness. There is no rebound.  Musculoskeletal:     Cervical back: Normal range of motion.  Skin:    General: Skin is warm and dry.  Neurological:     Mental Status: She is alert and oriented to person, place, and time.     Coordination: Coordination normal.     Vitals:   09/13/22 1019 09/13/22 1025  BP: (!) 122/96 112/60  Pulse: 62   Temp: 97.9 F (36.6 C)   TempSrc: Oral   SpO2: 96%   Weight: 220 lb 8 oz (100 kg)     Assessment & Plan:

## 2022-09-13 NOTE — Assessment & Plan Note (Signed)
Prior provider has retired and she is overdue for colonoscopy. Refer to GI and records in epic from last colonoscopy.

## 2022-11-09 ENCOUNTER — Encounter: Payer: Self-pay | Admitting: Skilled Nursing Facility1

## 2022-11-09 ENCOUNTER — Encounter: Payer: BC Managed Care – PPO | Attending: Internal Medicine | Admitting: Skilled Nursing Facility1

## 2022-11-09 VITALS — Ht 66.0 in | Wt 215.0 lb

## 2022-11-09 DIAGNOSIS — E119 Type 2 diabetes mellitus without complications: Secondary | ICD-10-CM | POA: Diagnosis not present

## 2022-11-09 NOTE — Progress Notes (Signed)
Diabetes Self-Management Education  Visit Type: First/Initial  Appt. Start Time: 10:02 Appt. End Time: 10:52  11/09/2022  [name], identified by name and date of birth, is a 62 y.o. female with a diagnosis of Diabetes: Type 2.   ASSESSMENT  Height 5\' 6"  (1.676 m), weight 215 lb (97.5 kg), last menstrual period 01/05/2011. Body mass index is 34.7 kg/m.  Pt inquires about fasting for her church: Dietitian advised she check her blood sugars and listens to her body and be open to breaking the fast if she is too ill or experiences low blood sugars to continue.  Pt states she works from home some days of the week and others in the office. Pt states working from home she was snacking more but has cut that back.  Pt states if she is sitting too long she will walk around at work. Pt states she has a stand up desk and will stand or do a little dancing.    Diabetes Self-Management Education - 11/09/22 1009       Visit Information   Visit Type First/Initial      Initial Visit   Diabetes Type Type 2    Are you currently following a meal plan? No    Are you taking your medications as prescribed? Not on Medications      Health Coping   How would you rate your overall health? Excellent      Psychosocial Assessment   Patient Belief/Attitude about Diabetes Motivated to manage diabetes    What is the hardest part about your diabetes right now, causing you the most concern, or is the most worrisome to you about your diabetes?   Making healty food and beverage choices    Self-care barriers None    Self-management support Family;Friends    Other persons present Friend    Patient Concerns Nutrition/Meal planning    Special Needs None    Preferred Learning Style Visual;Auditory    Learning Readiness Ready    How often do you need to have someone help you when you read instructions, pamphlets, or other written materials from your doctor or pharmacy? 1 - Never      Pre-Education  Assessment   Patient understands the diabetes disease and treatment process. Needs Instruction    Patient understands incorporating nutritional management into lifestyle. Needs Instruction    Patient undertands incorporating physical activity into lifestyle. Needs Instruction    Patient understands using medications safely. Needs Instruction    Patient understands monitoring blood glucose, interpreting and using results Needs Instruction    Patient understands prevention, detection, and treatment of acute complications. Needs Instruction    Patient understands how to develop strategies to address psychosocial issues. Needs Instruction    Patient understands how to develop strategies to promote health/change behavior. Needs Instruction      Complications   Last HgB A1C per patient/outside source 6.6 %    How often do you check your blood sugar? 0 times/day (not testing)    Have you had a dilated eye exam in the past 12 months? Yes    Have you had a dental exam in the past 12 months? No    Are you checking your feet? No      Dietary Intake   Breakfast skipped    Lunch skipped or mixed greens + ribs + corn + brown rice or canned soup or sandwich    Snack (afternoon) cookie or goodie    Dinner rainsin bran + 1  banana    Snack (evening) chips    Beverage(s) water, coffee, 100% juice, zero gingerale      Activity / Exercise   Activity / Exercise Type ADL's    How many days per week do you exercise? 0    How many minutes per day do you exercise? 0    Total minutes per week of exercise 0      Patient Education   Previous Diabetes Education No    Disease Pathophysiology Definition of diabetes, type 1 and 2, and the diagnosis of diabetes;Factors that contribute to the development of diabetes    Healthy Eating Role of diet in the treatment of diabetes and the relationship between the three main macronutrients and blood glucose level;Plate Method    Being Active Role of exercise on diabetes  management, blood pressure control and cardiac health.    Monitoring Taught/evaluated SMBG meter.    Acute complications Taught prevention, symptoms, and  treatment of hypoglycemia - the 15 rule.;Discussed and identified patients' prevention, symptoms, and treatment of hyperglycemia.    Chronic complications Dental care;Retinopathy and reason for yearly dilated eye exams;Nephropathy, what it is, prevention of, the use of ACE, ARB's and early detection of through urine microalbumia.;Relationship between chronic complications and blood glucose control      Individualized Goals (developed by patient)   Nutrition Follow meal plan discussed;General guidelines for healthy choices and portions discussed    Physical Activity Exercise 5-7 days per week;30 minutes per day    Monitoring  Test blood glucose pre and post meals as discussed    Problem Solving Eating Pattern    Reducing Risk do foot checks daily;treat hypoglycemia with 15 grams of carbs if blood glucose less than 70mg /dL      Post-Education Assessment   Patient understands the diabetes disease and treatment process. Demonstrates understanding / competency    Patient understands incorporating nutritional management into lifestyle. Demonstrates understanding / competency    Patient undertands incorporating physical activity into lifestyle. Demonstrates understanding / competency    Patient understands using medications safely. Demonstrates understanding / competency    Patient understands monitoring blood glucose, interpreting and using results Demonstrates understanding / competency    Patient understands prevention, detection, and treatment of acute complications. Demonstrates understanding / competency    Patient understands prevention, detection, and treatment of chronic complications. Demonstrates understanding / competency    Patient understands how to develop strategies to address psychosocial issues. Demonstrates understanding /  competency    Patient understands how to develop strategies to promote health/change behavior. Demonstrates understanding / competency      Outcomes   Expected Outcomes Demonstrated limited interest in learning.  Expect minimal changes    Future DMSE PRN    Program Status Completed             Individualized Plan for Diabetes Self-Management Training:   Learning Objective:  Patient will have a greater understanding of diabetes self-management. Patient education plan is to attend individual and/or group sessions per assessed needs and concerns.    Education material provided: ADA - How to Thrive: A Guide for Your Journey with Diabetes, My Plate, and Snack sheet  If problems or questions, patient to contact team via:  Phone and Email  Future DSME appointment: PRN

## 2023-02-01 DIAGNOSIS — H2513 Age-related nuclear cataract, bilateral: Secondary | ICD-10-CM | POA: Diagnosis not present

## 2023-02-01 DIAGNOSIS — H47323 Drusen of optic disc, bilateral: Secondary | ICD-10-CM | POA: Diagnosis not present

## 2023-02-01 DIAGNOSIS — H40013 Open angle with borderline findings, low risk, bilateral: Secondary | ICD-10-CM | POA: Diagnosis not present

## 2023-02-01 DIAGNOSIS — H1045 Other chronic allergic conjunctivitis: Secondary | ICD-10-CM | POA: Diagnosis not present

## 2023-05-23 ENCOUNTER — Other Ambulatory Visit: Payer: Self-pay | Admitting: Internal Medicine

## 2023-05-23 DIAGNOSIS — Z1231 Encounter for screening mammogram for malignant neoplasm of breast: Secondary | ICD-10-CM

## 2023-05-24 ENCOUNTER — Ambulatory Visit
Admission: RE | Admit: 2023-05-24 | Discharge: 2023-05-24 | Disposition: A | Discharge status: Home / Self Care | Payer: BC Managed Care – PPO | Source: Ambulatory Visit | Attending: Internal Medicine | Admitting: Internal Medicine

## 2023-05-24 DIAGNOSIS — Z1231 Encounter for screening mammogram for malignant neoplasm of breast: Secondary | ICD-10-CM | POA: Diagnosis not present

## 2023-05-26 LAB — HM MAMMOGRAPHY

## 2023-05-27 ENCOUNTER — Encounter: Payer: Self-pay | Admitting: Internal Medicine

## 2023-06-28 ENCOUNTER — Encounter: Payer: Self-pay | Admitting: Internal Medicine

## 2023-06-28 ENCOUNTER — Ambulatory Visit (INDEPENDENT_AMBULATORY_CARE_PROVIDER_SITE_OTHER): Payer: BC Managed Care – PPO | Admitting: Internal Medicine

## 2023-06-28 VITALS — BP 120/86 | HR 81 | Temp 98.2°F | Ht 66.0 in | Wt 208.0 lb

## 2023-06-28 DIAGNOSIS — R109 Unspecified abdominal pain: Secondary | ICD-10-CM

## 2023-06-28 MED ORDER — CYCLOBENZAPRINE HCL 5 MG PO TABS: 5.0000 mg | ORAL_TABLET | Freq: Three times a day (TID) | ORAL | 1 refills | Status: DC | PRN

## 2023-06-28 MED ORDER — DICLOFENAC SODIUM 1 % EX GEL: 2.0000 g | Freq: Four times a day (QID) | CUTANEOUS | 0 refills | Status: DC

## 2023-06-28 NOTE — Patient Instructions (Signed)
We have sent in voltaren gel to rub on 3 times a day as needed.   We have sent in flexeril which is a muscle relaxer to try for pain as needed.

## 2023-06-28 NOTE — Progress Notes (Signed)
Subjective:   Patient ID: Teresa West, female    DOB: 1961-03-16, 62 y.o.   MRN: [id number]  HPI The patient is a 62 YO female coming in for pain left flank and some in back. Thought this was gas related and took laxatives which did not improve pain with BMs. No diarrhea or constipation. Not worse with eating. Worse with some movements.   Review of Systems  Constitutional: Negative.   HENT: Negative.    Eyes: Negative.   Respiratory:  Negative for cough, chest tightness and shortness of breath.   Cardiovascular:  Negative for chest pain, palpitations and leg swelling.  Gastrointestinal:  Negative for abdominal distention, abdominal pain, constipation, diarrhea, nausea and vomiting.  Genitourinary:  Positive for flank pain.  Musculoskeletal:  Positive for back pain.  Skin: Negative.   Neurological: Negative.   Psychiatric/Behavioral: Negative.      Objective:  Physical Exam Constitutional:      Appearance: Normal appearance.  HENT:     Head: Normocephalic.  Cardiovascular:     Rate and Rhythm: Normal rate and regular rhythm.  Pulmonary:     Effort: Pulmonary effort is normal.  Musculoskeletal:        General: Tenderness present. Normal range of motion.  Skin:    General: Skin is warm and dry.  Neurological:     General: No focal deficit present.     Mental Status: She is alert and oriented to person, place, and time.     Vitals:   06/28/23 1056  BP: 120/86  Pulse: 81  Temp: 98.2 F (36.8 C)  TempSrc: Oral  SpO2: 97%  Weight: 208 lb (94.3 kg)  Height: 5\' 6"  (1.676 m)    Assessment & Plan:

## 2023-07-01 DIAGNOSIS — R10A2 Flank pain, left side: Secondary | ICD-10-CM | POA: Insufficient documentation

## 2023-07-01 DIAGNOSIS — R109 Unspecified abdominal pain: Secondary | ICD-10-CM | POA: Insufficient documentation

## 2023-07-01 NOTE — Assessment & Plan Note (Signed)
Rx voltaren gel and flexeril as likely source is muscular. We discussed that this could be kidney stone but story or week or two of pain waxing and waning is not typical for kidney stone. Not typical for diverticulitis.

## 2023-07-06 DIAGNOSIS — Z23 Encounter for immunization: Secondary | ICD-10-CM | POA: Diagnosis not present

## 2023-07-18 DIAGNOSIS — Z1211 Encounter for screening for malignant neoplasm of colon: Secondary | ICD-10-CM | POA: Diagnosis not present

## 2023-07-18 DIAGNOSIS — Z800 Family history of malignant neoplasm of digestive organs: Secondary | ICD-10-CM | POA: Diagnosis not present

## 2023-07-18 DIAGNOSIS — K648 Other hemorrhoids: Secondary | ICD-10-CM | POA: Diagnosis not present

## 2023-07-18 LAB — HM COLONOSCOPY

## 2023-07-19 ENCOUNTER — Encounter: Payer: Self-pay | Admitting: Internal Medicine

## 2023-08-25 ENCOUNTER — Ambulatory Visit (INDEPENDENT_AMBULATORY_CARE_PROVIDER_SITE_OTHER): Payer: BC Managed Care – PPO | Admitting: Podiatry

## 2023-08-25 DIAGNOSIS — L600 Ingrowing nail: Secondary | ICD-10-CM | POA: Diagnosis not present

## 2023-08-25 NOTE — Patient Instructions (Signed)

## 2023-08-25 NOTE — Progress Notes (Signed)
  Subjective:  Patient ID: Teresa West, female    DOB: 1961/01/11,  MRN: [id number]  Chief Complaint  Patient presents with   Ingrown Toenail    RM15: patient is here for bilateral  ingrown hallux    62 y.o. female presents with concern for bilateral hallux bilateral border ingrown pain.  Has had pain for a long time.  Previously had pedicure to remove them but they keep growing back and causing pain.  Past Medical History:  Diagnosis Date   Allergy    Anemia    Blood transfusion without reported diagnosis 1984   after childbirth   Diabetes mellitus without complication (HCC)    Mitral valve prolapse     Allergies  Allergen Reactions   Naproxen Nausea Only    ROS: Negative except as per HPI above  Objective:  General: AAO x3, NAD  Dermatological: Incurvation is present along the bilateral nail border of the bilateral great toe. There is localized edema without any erythema or increase in warmth around the nail border. There is no drainage or pus. There is no ascending cellulitis. No malodor. No open lesions or pre-ulcerative lesions.    Vascular:  Dorsalis Pedis artery and Posterior Tibial artery pedal pulses are 2/4 bilateral.  Capillary fill time < 3 sec to all digits.   Neruologic: Grossly intact via light touch bilateral. Protective threshold intact to all sites bilateral.   Musculoskeletal: No gross boney pedal deformities bilateral. No pain, crepitus, or limitation noted with foot and ankle range of motion bilateral. Muscular strength 5/5 in all groups tested bilateral.  Gait: Unassisted, Nonantalgic.   No images are attached to the encounter.  Assessment:   1. Ingrown nail of great toe of right foot   2. Ingrown nail of great toe of left foot      Plan:  Patient was evaluated and treated and all questions answered.  Ingrown Nail, bilaterally -Patient elects to proceed with minor surgery to remove ingrown toenail today. Consent reviewed and signed by  patient. -Ingrown nail excised. See procedure note. -Educated on post-procedure care including soaking. Written instructions provided and reviewed. -Patient to follow up in 2 weeks for nail check.  Procedure: Excision of Ingrown Toenail Location: Bilateral 1st toe  bilateral  nail borders. Anesthesia: Lidocaine 1% plain; 1.5 mL and Marcaine 0.5% plain; 1.5 mL, digital block. Skin Prep: Betadine. Dressing: Silvadene; telfa; dry, sterile, compression dressing. Technique: Following skin prep, the toe was exsanguinated and a tourniquet was secured at the base of the toe. The affected nail border was freed, split with a nail splitter, and excised. Chemical matrixectomy was then performed with Phenol and irrigated out with alcohol (if phenol) or vinegar (if NaOH). The tourniquet was then removed and sterile dressing applied. Disposition: Patient tolerated procedure well. Patient to return in 2 weeks for follow-up.    Return in about 2 weeks (around 09/08/2023) for nail check.          Corinna Gab, DPM [facility]

## 2023-08-28 ENCOUNTER — Encounter: Payer: Self-pay | Admitting: Internal Medicine

## 2023-09-06 ENCOUNTER — Ambulatory Visit (INDEPENDENT_AMBULATORY_CARE_PROVIDER_SITE_OTHER): Payer: BC Managed Care – PPO

## 2023-09-06 ENCOUNTER — Encounter: Payer: Self-pay | Admitting: Internal Medicine

## 2023-09-06 ENCOUNTER — Ambulatory Visit (INDEPENDENT_AMBULATORY_CARE_PROVIDER_SITE_OTHER): Payer: BC Managed Care – PPO | Admitting: Internal Medicine

## 2023-09-06 VITALS — HR 88 | Temp 98.5°F | Ht 66.0 in | Wt 208.0 lb

## 2023-09-06 DIAGNOSIS — R109 Unspecified abdominal pain: Secondary | ICD-10-CM

## 2023-09-06 DIAGNOSIS — M47816 Spondylosis without myelopathy or radiculopathy, lumbar region: Secondary | ICD-10-CM | POA: Diagnosis not present

## 2023-09-06 DIAGNOSIS — M47814 Spondylosis without myelopathy or radiculopathy, thoracic region: Secondary | ICD-10-CM | POA: Diagnosis not present

## 2023-09-06 DIAGNOSIS — E119 Type 2 diabetes mellitus without complications: Secondary | ICD-10-CM

## 2023-09-06 DIAGNOSIS — I878 Other specified disorders of veins: Secondary | ICD-10-CM | POA: Diagnosis not present

## 2023-09-06 LAB — CBC
HCT: 37.7 % (ref 36.0–46.0)
Hemoglobin: 12.4 g/dL (ref 12.0–15.0)
MCHC: 32.8 g/dL (ref 30.0–36.0)
MCV: 88.1 fL (ref 78.0–100.0)
Platelets: 208 10*3/uL (ref 150.0–400.0)
RBC: 4.28 Mil/uL (ref 3.87–5.11)
RDW: 13.3 % (ref 11.5–15.5)
WBC: 4.7 10*3/uL (ref 4.0–10.5)

## 2023-09-06 LAB — COMPREHENSIVE METABOLIC PANEL
ALT: 14 U/L (ref 0–35)
AST: 16 U/L (ref 0–37)
Albumin: 4.3 g/dL (ref 3.5–5.2)
Alkaline Phosphatase: 84 U/L (ref 39–117)
BUN: 15 mg/dL (ref 6–23)
CO2: 28 meq/L (ref 19–32)
Calcium: 9.1 mg/dL (ref 8.4–10.5)
Chloride: 105 meq/L (ref 96–112)
Creatinine, Ser: 0.68 mg/dL (ref 0.40–1.20)
GFR: 93.02 mL/min (ref >60.00)
Glucose, Bld: 107 mg/dL — ABNORMAL HIGH (ref 70–99)
Potassium: 4 meq/L (ref 3.5–5.1)
Sodium: 139 meq/L (ref 135–145)
Total Bilirubin: 0.4 mg/dL (ref 0.2–1.2)
Total Protein: 7.4 g/dL (ref 6.0–8.3)

## 2023-09-06 LAB — LIPID PANEL
Cholesterol: 160 mg/dL (ref 0–200)
HDL: 49.2 mg/dL (ref >39.00)
LDL Cholesterol: 80 mg/dL (ref 0–99)
NonHDL: 110.35
Total CHOL/HDL Ratio: 3
Triglycerides: 153 mg/dL — ABNORMAL HIGH (ref 0.0–149.0)
VLDL: 30.6 mg/dL (ref 0.0–40.0)

## 2023-09-06 LAB — HEMOGLOBIN A1C: Hgb A1c MFr Bld: 6.3 % (ref 4.6–6.5)

## 2023-09-06 LAB — MICROALBUMIN / CREATININE URINE RATIO
Creatinine,U: 105.9 mg/dL
Microalb Creat Ratio: 0.7 mg/g (ref 0.0–30.0)
Microalb, Ur: <0.7 mg/dL (ref 0.0–1.9)

## 2023-09-06 NOTE — Assessment & Plan Note (Signed)
Checking x-ray thoracic and lumbar suspect muscular strain. Can continue flexeril prn and try voltaren (she did not get last time).

## 2023-09-06 NOTE — Progress Notes (Signed)
   Subjective:   Patient ID: Teresa West, female    DOB: Apr 25, 1961, 62 y.o.   MRN: [id number]  Back Pain Pertinent negatives include no abdominal pain or chest pain.   The patient is a 62 YO female coming in for ongoing left flank pain. Did try flexeril and this makes her sleepy takes at night time. Worse with twisting. Sore all the time but not painful all the time.   Review of Systems  Constitutional: Negative.   HENT: Negative.    Eyes: Negative.   Respiratory:  Negative for cough, chest tightness and shortness of breath.   Cardiovascular:  Negative for chest pain, palpitations and leg swelling.  Gastrointestinal:  Negative for abdominal distention, abdominal pain, constipation, diarrhea, nausea and vomiting.  Musculoskeletal:  Positive for back pain.  Skin: Negative.   Neurological: Negative.   Psychiatric/Behavioral: Negative.      Objective:  Physical Exam Constitutional:      Appearance: She is well-developed.  HENT:     Head: Normocephalic and atraumatic.  Cardiovascular:     Rate and Rhythm: Normal rate and regular rhythm.  Pulmonary:     Effort: Pulmonary effort is normal. No respiratory distress.     Breath sounds: Normal breath sounds. No wheezing or rales.  Abdominal:     General: Bowel sounds are normal. There is no distension.     Palpations: Abdomen is soft.     Tenderness: There is no abdominal tenderness. There is no rebound.     Comments: Left flank tenderness with twisting  Musculoskeletal:     Cervical back: Normal range of motion.  Skin:    General: Skin is warm and dry.  Neurological:     Mental Status: She is alert and oriented to person, place, and time.     Coordination: Coordination normal.     Vitals:   09/06/23 1320  Pulse: 88  Temp: 98.5 F (36.9 C)  TempSrc: Oral  Weight: 208 lb (94.3 kg)  Height: 5\' 6"  (1.676 m)    Assessment & Plan:

## 2023-09-06 NOTE — Assessment & Plan Note (Signed)
Checking lipid panel, microalbumin to creatinine ratio and CMP and HgA1c. Adjust as needed. She is intentionally losing weight with weight watchers.

## 2023-09-06 NOTE — Patient Instructions (Addendum)
We will check the labs today and the x-ray.

## 2023-09-15 ENCOUNTER — Ambulatory Visit: Payer: BC Managed Care – PPO | Admitting: Podiatry

## 2023-09-20 ENCOUNTER — Other Ambulatory Visit: Payer: Self-pay | Admitting: Internal Medicine

## 2023-09-20 DIAGNOSIS — R9389 Abnormal findings on diagnostic imaging of other specified body structures: Secondary | ICD-10-CM

## 2023-09-22 ENCOUNTER — Ambulatory Visit: Payer: BC Managed Care – PPO | Admitting: Podiatry

## 2023-09-23 ENCOUNTER — Ambulatory Visit (INDEPENDENT_AMBULATORY_CARE_PROVIDER_SITE_OTHER): Payer: BC Managed Care – PPO

## 2023-09-23 ENCOUNTER — Ambulatory Visit (INDEPENDENT_AMBULATORY_CARE_PROVIDER_SITE_OTHER): Payer: BC Managed Care – PPO | Admitting: Podiatry

## 2023-09-23 ENCOUNTER — Encounter: Payer: Self-pay | Admitting: Podiatry

## 2023-09-23 DIAGNOSIS — L600 Ingrowing nail: Secondary | ICD-10-CM

## 2023-09-23 DIAGNOSIS — R9389 Abnormal findings on diagnostic imaging of other specified body structures: Secondary | ICD-10-CM | POA: Diagnosis not present

## 2023-09-23 DIAGNOSIS — I700 Atherosclerosis of aorta: Secondary | ICD-10-CM | POA: Diagnosis not present

## 2023-09-26 NOTE — Progress Notes (Signed)
       Chief Complaint  Patient presents with   Ingrown Toenail    Pt presents or ingrown toenail follow up states she is doing so much better.    HPI: 62 y.o. female presents today s/p bilateral great toe bilateral border partial nail avulsion with chemical matrixectomy on 11/4.  Patient is doing well.  Denies any pain.  She has been compliant with aftercare instructions, no longer soaking at this point as the areas have scabbed over.  Denies other pedal complaints.  Past Medical History:  Diagnosis Date   Allergy    Anemia    Blood transfusion without reported diagnosis 1984   after childbirth   Diabetes mellitus without complication (HCC)    Mitral valve prolapse     Past Surgical History:  Procedure Laterality Date   APPENDECTOMY     COLONOSCOPY WITH PROPOFOL N/A 06/07/2016   Procedure: COLONOSCOPY WITH PROPOFOL;  Surgeon: Charolett Bumpers, MD;  Location: [facility];  Service: Endoscopy;  Laterality: N/A;   ROTATOR CUFF REPAIR Left    TUBAL LIGATION  1984    Allergies  Allergen Reactions   Naproxen Nausea Only    ROS negative except as stated in HPI   Physical Exam: There were no vitals filed for this visit.  General: The patient is alert and oriented x3 in no acute distress.  Dermatology: Bilateral borders of bilateral first toes healing well with stable scabs appreciated.  No tenderness on palpation.  No associated erythema, edema or drainage appreciated.  Vascular: Palpable pedal pulses bilaterally. Capillary refill within normal limits.  No appreciable edema.  No erythema or calor.  Neurological: Light touch sensation grossly intact bilateral feet.     Assessment/Plan of Care: 1. Ingrown nail of great toe of right foot   2. Ingrown nail of great toe of left foot      No orders of the defined types were placed in this encounter.  None  Discussed clinical findings with patient today.  Plan: -Patient doing well - Cleared to resume all activity and  use any regular shoes as tolerated - Advised patient to allow scabs to fall off on her own, she may gently clean the areas with Q-tip dipped in isopropyl alcohol to push off any loose adhered scab. -Follow-up as needed.  Anjolaoluwa Siguenza L. Marchia Bond, AACFAS [facility]     [address] [place], [place] [address]                Office [phone]  Fax [phone]

## 2024-02-06 DIAGNOSIS — H40013 Open angle with borderline findings, low risk, bilateral: Secondary | ICD-10-CM | POA: Diagnosis not present

## 2024-02-06 DIAGNOSIS — H1045 Other chronic allergic conjunctivitis: Secondary | ICD-10-CM | POA: Diagnosis not present

## 2024-02-06 DIAGNOSIS — H47323 Drusen of optic disc, bilateral: Secondary | ICD-10-CM | POA: Diagnosis not present

## 2024-02-06 DIAGNOSIS — H2513 Age-related nuclear cataract, bilateral: Secondary | ICD-10-CM | POA: Diagnosis not present

## 2024-03-02 ENCOUNTER — Encounter: Payer: Self-pay | Admitting: Internal Medicine

## 2024-07-03 ENCOUNTER — Encounter: Payer: Self-pay | Admitting: Internal Medicine

## 2024-07-03 DIAGNOSIS — Z23 Encounter for immunization: Secondary | ICD-10-CM | POA: Diagnosis not present

## 2024-07-04 NOTE — Telephone Encounter (Signed)
 This has been updated.

## 2024-08-17 ENCOUNTER — Other Ambulatory Visit: Payer: Self-pay | Admitting: Internal Medicine

## 2024-08-17 DIAGNOSIS — Z1231 Encounter for screening mammogram for malignant neoplasm of breast: Secondary | ICD-10-CM

## 2024-08-21 ENCOUNTER — Ambulatory Visit
Admission: RE | Admit: 2024-08-21 | Discharge: 2024-08-21 | Disposition: A | Discharge status: Home / Self Care | Source: Ambulatory Visit | Attending: Internal Medicine | Admitting: Internal Medicine

## 2024-08-21 DIAGNOSIS — Z1231 Encounter for screening mammogram for malignant neoplasm of breast: Secondary | ICD-10-CM

## 2024-08-23 ENCOUNTER — Ambulatory Visit: Payer: Self-pay | Admitting: Internal Medicine

## 2024-10-29 ENCOUNTER — Encounter: Admitting: Internal Medicine

## 2024-10-29 ENCOUNTER — Encounter: Payer: Self-pay | Admitting: Internal Medicine

## 2024-10-29 ENCOUNTER — Ambulatory Visit (INDEPENDENT_AMBULATORY_CARE_PROVIDER_SITE_OTHER): Admitting: Internal Medicine

## 2024-10-29 VITALS — BP 120/80 | HR 76 | Temp 98.5°F | Ht 66.0 in | Wt 209.4 lb

## 2024-10-29 DIAGNOSIS — E118 Type 2 diabetes mellitus with unspecified complications: Secondary | ICD-10-CM | POA: Diagnosis not present

## 2024-10-29 DIAGNOSIS — Z0000 Encounter for general adult medical examination without abnormal findings: Secondary | ICD-10-CM

## 2024-10-29 DIAGNOSIS — Z23 Encounter for immunization: Secondary | ICD-10-CM

## 2024-10-29 LAB — LIPID PANEL
Cholesterol: 164 mg/dL (ref 28–200)
HDL: 65.8 mg/dL
LDL Cholesterol: 82 mg/dL (ref 10–99)
NonHDL: 97.96
Total CHOL/HDL Ratio: 2
Triglycerides: 80 mg/dL (ref 10.0–149.0)
VLDL: 16 mg/dL (ref 0.0–40.0)

## 2024-10-29 LAB — COMPREHENSIVE METABOLIC PANEL WITH GFR
ALT: 18 U/L (ref 3–35)
AST: 20 U/L (ref 5–37)
Albumin: 4.4 g/dL (ref 3.5–5.2)
Alkaline Phosphatase: 71 U/L (ref 39–117)
BUN: 10 mg/dL (ref 6–23)
CO2: 29 meq/L (ref 19–32)
Calcium: 9.8 mg/dL (ref 8.4–10.5)
Chloride: 103 meq/L (ref 96–112)
Creatinine, Ser: 0.67 mg/dL (ref 0.40–1.20)
GFR: 92.6 mL/min
Glucose, Bld: 91 mg/dL (ref 70–99)
Potassium: 4.1 meq/L (ref 3.5–5.1)
Sodium: 139 meq/L (ref 135–145)
Total Bilirubin: 0.6 mg/dL (ref 0.2–1.2)
Total Protein: 7.8 g/dL (ref 6.0–8.3)

## 2024-10-29 LAB — CBC
HCT: 37.5 % (ref 36.0–46.0)
Hemoglobin: 12.5 g/dL (ref 12.0–15.0)
MCHC: 33.4 g/dL (ref 30.0–36.0)
MCV: 85.9 fl (ref 78.0–100.0)
Platelets: 201 K/uL (ref 150.0–400.0)
RBC: 4.36 Mil/uL (ref 3.87–5.11)
RDW: 12.9 % (ref 11.5–15.5)
WBC: 4.4 K/uL (ref 4.0–10.5)

## 2024-10-29 LAB — MICROALBUMIN / CREATININE URINE RATIO
Creatinine,U: 38.4 mg/dL
Microalb Creat Ratio: UNDETERMINED mg/g (ref 0.0–30.0)
Microalb, Ur: <0.7 mg/dL

## 2024-10-29 LAB — HEMOGLOBIN A1C: Hgb A1c MFr Bld: 6.4 % (ref 4.6–6.5)

## 2024-10-29 NOTE — Progress Notes (Unsigned)
" ° °  Subjective:   Patient ID: Teresa West, female    DOB: 01/07/1961, 64 y.o.   MRN: [id number]  The patient is here for physical. Pertinent topics discussed: Discussed the use of AI scribe software for clinical note transcription with the patient, who gave verbal consent to proceed.  History of Present Illness Teresa West is a 64 year old female who presents for a routine follow-up visit.  She experiences soreness on the right side, particularly in the right leg, which she attributes to prolonged sitting. There is a 'catch' sensation when sitting, which loosens up upon standing and walking. This soreness is primarily experienced when she is at her desk for extended periods, despite getting up periodically. No new muscle or joint pain aside from the right side soreness.  She engages in physical activities such as lifting and moving furniture at work, which she speculates might impact her symptoms. No new chest pains, tightness, or pressure. No new breathing problems, including shortness of breath or persistent coughing. She has never smoked.  No new gastrointestinal issues such as diarrhea, constipation, heartburn, or blood in stools. No headaches or migraines. Her vision remains stable, and she visits the eye doctor occasionally.  Her blood sugar levels were previously in the early diabetes range, and her last A1c was 5.0 in September. Her last A1c was 5.0 in September.  She remains physically active at work, engaging in activities such as moving tables and chairs, which she considers part of her exercise routine.  PMH, [facility], social history reviewed and updated  Review of Systems  Constitutional: Negative.   HENT: Negative.    Eyes: Negative.   Respiratory:  Negative for cough, chest tightness and shortness of breath.   Cardiovascular:  Negative for chest pain, palpitations and leg swelling.  Gastrointestinal:  Negative for abdominal distention, abdominal pain, constipation,  diarrhea, nausea and vomiting.  Musculoskeletal: Negative.   Skin: Negative.   Neurological: Negative.   Psychiatric/Behavioral: Negative.      Objective:  Physical Exam Constitutional:      Appearance: She is well-developed.  HENT:     Head: Normocephalic and atraumatic.  Cardiovascular:     Rate and Rhythm: Normal rate and regular rhythm.  Pulmonary:     Effort: Pulmonary effort is normal. No respiratory distress.     Breath sounds: Normal breath sounds. No wheezing or rales.  Abdominal:     General: Bowel sounds are normal. There is no distension.     Palpations: Abdomen is soft.     Tenderness: There is no abdominal tenderness.  Musculoskeletal:     Cervical back: Normal range of motion.  Skin:    General: Skin is warm and dry.     Comments: Foot exam done  Neurological:     Mental Status: She is alert and oriented to person, place, and time.     Coordination: Coordination normal.     Vitals:   10/29/24 1445  BP: 120/80  Pulse: 76  Temp: 98.5 F (36.9 C)  TempSrc: Oral  SpO2: 98%  Weight: 209 lb 6.4 oz (95 kg)  Height: 5' 6 (1.676 m)    Assessment & Plan:  Prevnar 20 given at visit "

## 2024-10-29 NOTE — Assessment & Plan Note (Signed)
 Flu shot up to date. Pneumonia 20 given. Shingrix  complete. Tetanus up to date. Colonoscopy upto date. Mammogram upto date, pap smear up to date. Counseled about sun safety and mole surveillance. Counseled about the dangers of distracted driving. Given 10 year screening recommendations.

## 2024-10-29 NOTE — Assessment & Plan Note (Signed)
 Foot exam done. Checking UACR, HGA1c lipid panel and CMP. Adjust as needed. Recent screening with her work HgA1c 5% if stable in pre-diabetes range will switch diagnosis since last also in pre-diabetes range without meds Nov 2024.

## 2024-11-02 ENCOUNTER — Ambulatory Visit: Payer: Self-pay | Admitting: Internal Medicine
